# Patient Record
Sex: Female | Born: 1975 | Race: Black or African American | Hispanic: No | Marital: Single | Smoking: Never smoker
Health system: Southern US, Community
[De-identification: ages and names within clinical notes are randomized; demographics above are authoritative.]

## PROBLEM LIST (undated history)

## (undated) DIAGNOSIS — IMO0002 Reserved for concepts with insufficient information to code with codable children: Secondary | ICD-10-CM

## (undated) DIAGNOSIS — T7840XA Allergy, unspecified, initial encounter: Secondary | ICD-10-CM

## (undated) DIAGNOSIS — G4710 Hypersomnia, unspecified: Secondary | ICD-10-CM

## (undated) DIAGNOSIS — R0602 Shortness of breath: Secondary | ICD-10-CM

## (undated) DIAGNOSIS — R002 Palpitations: Secondary | ICD-10-CM

## (undated) DIAGNOSIS — B009 Herpesviral infection, unspecified: Secondary | ICD-10-CM

## (undated) HISTORY — DX: Shortness of breath: R06.02

## (undated) HISTORY — DX: Herpesviral infection, unspecified: B00.9

## (undated) HISTORY — DX: Reserved for concepts with insufficient information to code with codable children: IMO0002

## (undated) HISTORY — DX: Hypersomnia, unspecified: G47.10

## (undated) HISTORY — DX: Allergy, unspecified, initial encounter: T78.40XA

## (undated) HISTORY — DX: Palpitations: R00.2

---

## 2001-01-01 ENCOUNTER — Other Ambulatory Visit: Admission: RE | Admit: 2001-01-01 | Discharge: 2001-01-01 | Payer: Self-pay

## 2002-08-13 ENCOUNTER — Other Ambulatory Visit: Admission: RE | Admit: 2002-08-13 | Discharge: 2002-08-13 | Payer: Self-pay | Admitting: Obstetrics and Gynecology

## 2003-05-31 ENCOUNTER — Emergency Department (HOSPITAL_COMMUNITY): Admission: AD | Admit: 2003-05-31 | Discharge: 2003-05-31 | Payer: Self-pay | Admitting: Emergency Medicine

## 2003-07-02 ENCOUNTER — Other Ambulatory Visit: Admission: RE | Admit: 2003-07-02 | Discharge: 2003-07-02 | Payer: Self-pay | Admitting: Obstetrics and Gynecology

## 2004-01-06 ENCOUNTER — Ambulatory Visit (HOSPITAL_COMMUNITY): Admission: RE | Admit: 2004-01-06 | Discharge: 2004-01-06 | Payer: Self-pay | Admitting: Obstetrics and Gynecology

## 2004-02-03 ENCOUNTER — Other Ambulatory Visit: Admission: RE | Admit: 2004-02-03 | Discharge: 2004-02-03 | Payer: Self-pay | Admitting: Obstetrics and Gynecology

## 2004-04-28 ENCOUNTER — Ambulatory Visit (HOSPITAL_COMMUNITY): Admission: RE | Admit: 2004-04-28 | Discharge: 2004-04-28 | Payer: Self-pay | Admitting: Obstetrics and Gynecology

## 2004-06-17 ENCOUNTER — Ambulatory Visit (HOSPITAL_COMMUNITY): Admission: RE | Admit: 2004-06-17 | Discharge: 2004-06-17 | Payer: Self-pay | Admitting: Obstetrics and Gynecology

## 2004-09-04 ENCOUNTER — Inpatient Hospital Stay (HOSPITAL_COMMUNITY): Admission: AD | Admit: 2004-09-04 | Discharge: 2004-09-07 | Payer: Self-pay | Admitting: Obstetrics and Gynecology

## 2005-10-24 ENCOUNTER — Other Ambulatory Visit: Admission: RE | Admit: 2005-10-24 | Discharge: 2005-10-24 | Payer: Self-pay | Admitting: Obstetrics and Gynecology

## 2010-05-04 ENCOUNTER — Other Ambulatory Visit: Payer: Self-pay

## 2010-05-04 ENCOUNTER — Other Ambulatory Visit: Payer: Self-pay | Admitting: Family Medicine

## 2010-05-04 DIAGNOSIS — M25572 Pain in left ankle and joints of left foot: Secondary | ICD-10-CM

## 2010-05-07 ENCOUNTER — Other Ambulatory Visit: Payer: Self-pay | Admitting: Orthopedic Surgery

## 2010-05-08 ENCOUNTER — Ambulatory Visit
Admission: RE | Admit: 2010-05-08 | Discharge: 2010-05-08 | Disposition: A | Discharge status: Home / Self Care | Payer: 59 | Source: Ambulatory Visit | Attending: Orthopedic Surgery | Admitting: Orthopedic Surgery

## 2010-05-08 ENCOUNTER — Other Ambulatory Visit: Payer: Self-pay

## 2010-09-18 DIAGNOSIS — IMO0002 Reserved for concepts with insufficient information to code with codable children: Secondary | ICD-10-CM

## 2010-09-18 DIAGNOSIS — R87612 Low grade squamous intraepithelial lesion on cytologic smear of cervix (LGSIL): Secondary | ICD-10-CM

## 2010-09-18 DIAGNOSIS — R87619 Unspecified abnormal cytological findings in specimens from cervix uteri: Secondary | ICD-10-CM

## 2010-09-18 HISTORY — DX: Reserved for concepts with insufficient information to code with codable children: IMO0002

## 2010-09-18 HISTORY — DX: Low grade squamous intraepithelial lesion on cytologic smear of cervix (LGSIL): R87.612

## 2010-09-18 HISTORY — DX: Unspecified abnormal cytological findings in specimens from cervix uteri: R87.619

## 2011-01-06 ENCOUNTER — Ambulatory Visit (INDEPENDENT_AMBULATORY_CARE_PROVIDER_SITE_OTHER): Payer: 59

## 2011-01-06 DIAGNOSIS — J069 Acute upper respiratory infection, unspecified: Secondary | ICD-10-CM

## 2011-01-06 DIAGNOSIS — J029 Acute pharyngitis, unspecified: Secondary | ICD-10-CM

## 2011-10-24 ENCOUNTER — Ambulatory Visit: Payer: Self-pay | Admitting: Obstetrics and Gynecology

## 2011-11-29 ENCOUNTER — Encounter: Payer: Self-pay | Admitting: Obstetrics and Gynecology

## 2011-11-29 ENCOUNTER — Ambulatory Visit (INDEPENDENT_AMBULATORY_CARE_PROVIDER_SITE_OTHER): Payer: 59 | Admitting: Obstetrics and Gynecology

## 2011-11-29 VITALS — BP 130/84 | HR 92 | Ht 67.5 in | Wt 364.5 lb

## 2011-11-29 DIAGNOSIS — Z124 Encounter for screening for malignant neoplasm of cervix: Secondary | ICD-10-CM

## 2011-11-29 DIAGNOSIS — N920 Excessive and frequent menstruation with regular cycle: Secondary | ICD-10-CM

## 2011-11-29 LAB — CBC
HCT: 36.7 % (ref 36.0–46.0)
Hemoglobin: 12.2 g/dL (ref 12.0–15.0)
MCH: 26.5 pg (ref 26.0–34.0)
MCV: 79.6 fL (ref 78.0–100.0)
Platelets: 308 10*3/uL (ref 150–400)
RBC: 4.61 MIL/uL (ref 3.87–5.11)
RDW: 15.8 % — ABNORMAL HIGH (ref 11.5–15.5)
WBC: 4.7 10*3/uL (ref 4.0–10.5)

## 2011-11-29 NOTE — Progress Notes (Signed)
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 Parke Simmers Change in PMH: none Change in WUJ:WJXB BP 130/84  Pulse 92  Ht 5' 7.5" (1.715 m)  Wt 364 lb 8 oz (165.336 kg)  BMI 56.25 kg/m2  LMP 11/22/2011 Pt with complaints: yes.  Her periods are q month lasting for 7-8 days.  No chest pain or sob.   Physical Examination: General appearance - alert, well appearing, and in no distress Mental status - normal mood, behavior, speech, dress, motor activity, and thought processes Neck - supple, no significant adenopathy,  thyroid exam: thyroid is normal in size without nodules or tenderness Chest - clear to auscultation, no wheezes, rales or rhonchi, symmetric air entry Heart - normal rate and regular rhythm Abdomen - soft, nontender, nondistended, no masses or organomegaly Breasts - breasts appear normal, no suspicious masses, no skin or nipple changes or axillary nodes Pelvic - normal external genitalia, vulva, vagina, cervix, uterus and adnexa Rectal - rectal exam not indicated Back exam - full range of motion, no tenderness, palpable spasm or pain on motion Neurological - alert, oriented, normal speech, no focal findings or movement disorder noted Musculoskeletal - no joint tenderness, deformity or swelling Extremities - no edema, redness or tenderness in the calves or thighs Skin - normal coloration and turgor, no rashes, no suspicious skin lesions noted Routine exam Pap sent yes h/o CIN 1 Mammogram due no abstinance used for contraception RT for shg/embx

## 2011-11-29 NOTE — Patient Instructions (Signed)

## 2011-12-01 ENCOUNTER — Telehealth: Payer: Self-pay | Admitting: Obstetrics and Gynecology

## 2011-12-01 ENCOUNTER — Encounter: Payer: Self-pay | Admitting: Obstetrics and Gynecology

## 2011-12-01 ENCOUNTER — Ambulatory Visit: Payer: Self-pay | Admitting: Obstetrics and Gynecology

## 2011-12-01 LAB — PAP IG W/ RFLX HPV ASCU

## 2011-12-01 NOTE — Telephone Encounter (Signed)
Tc to pt regarding msg, informed pt can pick up letter stating that she was here for an appt on 11/29/11 w/ ND whenever she is available to do so, pt voices agreement.

## 2011-12-29 ENCOUNTER — Encounter: Payer: 59 | Admitting: Obstetrics and Gynecology

## 2012-01-23 ENCOUNTER — Encounter: Payer: 59 | Admitting: Obstetrics and Gynecology

## 2012-03-07 ENCOUNTER — Ambulatory Visit (INDEPENDENT_AMBULATORY_CARE_PROVIDER_SITE_OTHER): Payer: BC Managed Care – PPO | Admitting: Family Medicine

## 2012-03-07 ENCOUNTER — Ambulatory Visit: Payer: BC Managed Care – PPO

## 2012-03-07 ENCOUNTER — Ambulatory Visit: Payer: Self-pay

## 2012-03-07 VITALS — BP 117/84 | HR 91 | Temp 98.4°F | Resp 17 | Ht 68.0 in | Wt 360.0 lb

## 2012-03-07 DIAGNOSIS — R05 Cough: Secondary | ICD-10-CM

## 2012-03-07 DIAGNOSIS — R062 Wheezing: Secondary | ICD-10-CM

## 2012-03-07 DIAGNOSIS — R059 Cough, unspecified: Secondary | ICD-10-CM

## 2012-03-07 DIAGNOSIS — J069 Acute upper respiratory infection, unspecified: Secondary | ICD-10-CM

## 2012-03-07 DIAGNOSIS — J019 Acute sinusitis, unspecified: Secondary | ICD-10-CM

## 2012-03-07 MED ORDER — IPRATROPIUM-ALBUTEROL 20-100 MCG/ACT IN AERS: 1.0000 | INHALATION_SPRAY | Freq: Four times a day (QID) | RESPIRATORY_TRACT | Status: DC | PRN

## 2012-03-07 MED ORDER — ALBUTEROL SULFATE (2.5 MG/3ML) 0.083% IN NEBU
2.5000 mg | INHALATION_SOLUTION | Freq: Once | RESPIRATORY_TRACT | Status: AC
  Administered 2012-03-07: 2.5 mg via RESPIRATORY_TRACT

## 2012-03-07 MED ORDER — BENZONATATE 100 MG PO CAPS: 200.0000 mg | ORAL_CAPSULE | Freq: Two times a day (BID) | ORAL | Status: DC | PRN

## 2012-03-07 MED ORDER — IPRATROPIUM BROMIDE 0.02 % IN SOLN
0.5000 mg | Freq: Once | RESPIRATORY_TRACT | Status: AC
  Administered 2012-03-07: 0.5 mg via RESPIRATORY_TRACT

## 2012-03-07 MED ORDER — LEVOFLOXACIN 500 MG PO TABS: 500.0000 mg | ORAL_TABLET | Freq: Every day | ORAL | Status: DC

## 2012-03-07 MED ORDER — HYDROCODONE-HOMATROPINE 5-1.5 MG/5ML PO SYRP: 5.0000 mL | ORAL_SOLUTION | Freq: Every evening | ORAL | Status: DC | PRN

## 2012-03-07 NOTE — Progress Notes (Signed)
[facility]:  Office Visit  Chief Complaint:  Chief Complaint  Patient presents with  . Cough  . Wheezing  . Headache    HPI: Jasmin Gonzalez is a 37 y.o. female who complains of  4 day history of dry cough, worsening. Sinus congestion/pain. Headache. No ear pain. + chills yesterday and then also this AM. Has tried Mucinex, alkaselzter drops.  + wheezing at night and earlier this AM.  Chest pain with coughing.+ h/o allergies, N/o asthma, tobacco use.  Past Medical History  Diagnosis Date  . Abnormal Pap smear 9/12  . 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 HSV-1 (herpes simplex virus 1) infection   . Allergy    Past Surgical History  Procedure Laterality Date  . Cesarean section     History   Social History  . Marital Status: Married    Spouse Name: N/A    Number of Children: N/A  . Years of Education: N/A   Social History Main Topics  . Smoking status: Never Smoker   . Smokeless tobacco: None  . Alcohol Use: Yes     Comment: rarely   . Drug Use: No  . Sexually Active: Yes    Birth Control/ Protection: None, Implant   Other Topics Concern  . None   Social History Narrative  . None   Family History  Problem Relation Age of Onset  . COPD Maternal Grandfather    Allergies  Allergen Reactions  . Amoxicillin   . Penicillins    Prior to Admission medications   Medication Sig Start Date End Date Taking? Authorizing Provider  folic acid (FOLVITE) 1 MG tablet Take 1 mg by mouth daily.   Yes Historical Provider, MD  hydrOXYzine (ATARAX/VISTARIL) 25 MG tablet Take 25 mg by mouth 3 (three) times daily as needed.    Historical Provider, MD     ROS: The patient denies night sweats, unintentional weight loss, dyspnea on exertion, nausea, vomiting, abdominal pain, dysuria, hematuria, melena, numbness, weakness, or tingling.   All other systems have been reviewed and were otherwise negative with the exception of those  mentioned in the HPI and as above.    PHYSICAL EXAM: Filed Vitals:   03/07/12 1358  BP: 117/84  Pulse: 91  Temp: 98.4 F (36.9 C)  Resp: 17   Filed Vitals:   03/07/12 1358  Height: 5\' 8"  (1.727 m)  Weight: 360 lb (163.295 kg)   Body mass index is 54.75 kg/(m^2).  General: Alert, no acute distress, tired appearing, obese AA female HEENT:  Normocephalic, atraumatic, oropharynx patent.  Cardiovascular:  Regular rate and rhythm, no rubs murmurs or gallops.  No Carotid bruits, radial pulse intact. No pedal edema.  Respiratory: Clear to auscultation bilaterally.  + wheezes; No rales, or rhonchi.  No cyanosis, no use of accessory musculature GI: No organomegaly, abdomen is soft and non-tender, positive bowel sounds.  No masses. Skin: No rashes. Neurologic: Facial musculature symmetric. Psychiatric: Patient is appropriate throughout our interaction. Lymphatic: No cervical lymphadenopathy Musculoskeletal: Gait intact.   LABS: Results for orders placed in visit on 11/29/11  CBC      Result Value Range   WBC 4.7  4.0 - 10.5 K/uL   RBC 4.61  3.87 - 5.11 MIL/uL   Hemoglobin 12.2  12.0 - 15.0 g/dL   HCT 45.4  09.8 - 11.9 %   MCV 79.6  78.0 - 100.0 fL   MCH 26.5  26.0 -  34.0 pg   MCHC 33.2  30.0 - 36.0 g/dL   RDW 16.1 (*) 09.6 - 04.5 %   Platelets 308  150 - 400 K/uL  PAP IG W/ RFLX HPV ASCU      Result Value Range   Specimen adequacy:       FINAL DIAGNOSIS:   (*)    COMMENTS:       RECOMMENDATIONS:       Cytotechnologist:       Pathologist:         EKG/XRAY:   Primary read interpreted by Dr. Conley Rolls at [facility]. ? suspicious for Right Lower Lobe PNA vs increase vascular markings   ASSESSMENT/PLAN: Encounter Diagnoses  Name Primary?  . Acute sinusitis Yes  . Acute upper respiratory infections of unspecified site   . Cough   . Wheezing    Presumptive treatment of PNA vs acute sinusitis with bronchitis Rx Levaquin Rx Tessalon Perles, Hydromet Rx Combivent Work note for 2  days off Duoneb neb treatement, wheezing improved after treatment Pre and post peak flow--250 and 400    Jasmin Schrodt PHUONG, DO 03/07/2012 2:30 PM

## 2013-05-16 ENCOUNTER — Encounter: Payer: Self-pay | Admitting: Cardiology

## 2013-05-24 ENCOUNTER — Ambulatory Visit: Payer: 59 | Admitting: Cardiology

## 2013-06-12 ENCOUNTER — Encounter: Payer: Self-pay | Admitting: Cardiology

## 2013-06-12 ENCOUNTER — Ambulatory Visit (INDEPENDENT_AMBULATORY_CARE_PROVIDER_SITE_OTHER): Payer: BC Managed Care – PPO | Admitting: Cardiology

## 2013-06-12 VITALS — BP 136/74 | HR 87 | Ht 68.0 in | Wt 360.0 lb

## 2013-06-12 DIAGNOSIS — R002 Palpitations: Secondary | ICD-10-CM

## 2013-06-12 HISTORY — DX: Palpitations: R00.2

## 2013-06-12 NOTE — Progress Notes (Signed)
  [address], [address] [place], [place]  [address] Phone: [phone] Fax:  [phone]  Date:  06/12/2013   ID:  VERDELLA STOEN, DOB [birth year], MRN [id number]  PCP:  Geraldo Pitter, MD  Cardiologist:  Armanda Magic, MD     History of Present Illness: Jasmin Gonzalez is a 38 y.o. female with no prior cardiac history presents today for evaluation of fluttering in her chest like a butterfly across her chest.  This started back in January and since then has become more frequent.  She saw her PCP and she told her that she had chest wall inflammation and was given Naproxen.  She said the Naproxen made her feel worse.  Sometimes she will feel a little shock with it.  She has noticed recently that she has had SOB mainly with exertion when walking or up moving around.  She has had some LE edema which is a chronic problem but she has noticed an increase in February.  She denies any dizziness or syncope.   Wt Readings from Last 3 Encounters:  06/12/13 360 lb (163.295 kg)  03/07/12 360 lb (163.295 kg)  11/29/11 364 lb 8 oz (165.336 kg)     Past Medical History  Diagnosis Date  . Abnormal Pap smear 9/12  . 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 HSV-1 (herpes simplex virus 1) infection   . Allergy     No current outpatient prescriptions on file.   No current facility-administered medications for this visit.    Allergies:    Allergies  Allergen Reactions  . Amoxicillin   . Penicillins     Social History:  The patient  reports that she has never smoked. She does not have any smokeless tobacco history on file. She reports that she drinks alcohol. She reports that she does not use illicit drugs.   Family History:  The patient's family history includes COPD in her maternal grandfather.   ROS:  Please see the history of present illness.      All other systems reviewed and negative.   PHYSICAL EXAM: VS:  BP 136/74  Pulse 87  Ht 5\' 8"  (1.727 m)  Wt 360 lb  (163.295 kg)  BMI 54.75 kg/m2 Well nourished, well developed, in no acute distress HEENT: normal Neck: no JVD Cardiac:  normal S1, S2; RRR; no murmur Lungs:  clear to auscultation bilaterally, no wheezing, rhonchi or rales Abd: soft, nontender, no hepatomegaly Ext: trace edema Skin: warm and dry Neuro:  CNs 2-12 intact, no focal abnormalities noted  EKG:  NSR with no ST changes and normal intervals     ASSESSMENT AND PLAN:  1. Heart palpitations with what she describes as a fluttering - event monitor to assess for arrhythmias  Followup with me in 4 weeks  Signed, Armanda Magic, MD 06/12/2013 3:44 PM

## 2013-06-12 NOTE — Patient Instructions (Signed)
Your physician has recommended that you wear an event monitor. Event monitors are medical devices that record the heart's electrical activity. Doctors most often [place] these monitors to diagnose arrhythmias. Arrhythmias are problems with the speed or rhythm of the heartbeat. The monitor is a small, portable device. You can wear one while you do your normal daily activities. This is usually used to diagnose what is causing palpitations/syncope (passing out).  Your physician recommends that you schedule a follow-up appointment in: 4 weeks ov after event monitor placed

## 2013-06-13 ENCOUNTER — Encounter (INDEPENDENT_AMBULATORY_CARE_PROVIDER_SITE_OTHER): Payer: BC Managed Care – PPO

## 2013-06-13 ENCOUNTER — Encounter: Payer: Self-pay | Admitting: *Deleted

## 2013-06-13 DIAGNOSIS — R002 Palpitations: Secondary | ICD-10-CM

## 2013-06-13 NOTE — Progress Notes (Signed)
Patient ID: Jasmin Gonzalez, female   DOB: 09-25-1975, 38 y.o.   MRN: [id number] [employer] 30 day cardiac event monitor applied to patient.

## 2013-07-17 ENCOUNTER — Encounter: Payer: Self-pay | Admitting: Cardiology

## 2013-07-17 ENCOUNTER — Ambulatory Visit (INDEPENDENT_AMBULATORY_CARE_PROVIDER_SITE_OTHER): Payer: BC Managed Care – PPO | Admitting: Cardiology

## 2013-07-17 VITALS — BP 122/92 | HR 90 | Ht 67.0 in | Wt 362.0 lb

## 2013-07-17 DIAGNOSIS — R600 Localized edema: Secondary | ICD-10-CM | POA: Insufficient documentation

## 2013-07-17 DIAGNOSIS — R0602 Shortness of breath: Secondary | ICD-10-CM

## 2013-07-17 DIAGNOSIS — G4710 Hypersomnia, unspecified: Secondary | ICD-10-CM

## 2013-07-17 DIAGNOSIS — R002 Palpitations: Secondary | ICD-10-CM

## 2013-07-17 DIAGNOSIS — R609 Edema, unspecified: Secondary | ICD-10-CM

## 2013-07-17 HISTORY — DX: Shortness of breath: R06.02

## 2013-07-17 HISTORY — DX: Hypersomnia, unspecified: G47.10

## 2013-07-17 NOTE — Patient Instructions (Addendum)
Your physician recommends that you continue on your current medications as directed. Please refer to the Current Medication list given to you today.  Your physician has recommended that you have a sleep study. This test records several body functions during sleep, including: brain activity, eye movement, oxygen and carbon dioxide blood levels, heart rate and rhythm, breathing rate and rhythm, the flow of air through your mouth and nose, snoring, body muscle movements, and chest and belly movement. ([facility])  Your physician has requested that you have an echocardiogram. Echocardiography is a painless test that uses sound waves to create images of your heart. It provides your doctor with information about the size and shape of your heart and how well your heart's chambers and valves are working. This procedure takes approximately one hour. There are no restrictions for this procedure.  Your physician recommends that you schedule a follow-up appointment As needed

## 2013-07-17 NOTE — Progress Notes (Signed)
[address] [place], [place]  [address] Phone: [phone]) [phone] Fax:  [phone]) [phone]  Date:  07/17/2013   ID:  Jasmin Gonzalez, DOB 1975-06-26, MRN [id number]  PCP:  Geraldo PitterBLAND,VEITA J, MD  Cardiologist:  Armanda Magicraci Turner, MD     History of Present Illness:  Jasmin Gonzalez is a 38 y.o. female with no prior cardiac history presents today for followup of fluttering in her chest like a butterfly across her chest. This started back in January and since then has become more frequent. She saw her PCP and she told her that she had chest wall inflammation and was given Naproxen. She said the Naproxen made her feel worse. Sometimes she will feel a little shock with it. She has noticed recently that she has had SOB mainly with exertion when walking or up moving around. She has had some LE edema which is a chronic problem but she has noticed an increase in February. She denies any dizziness or syncope. She says that she is still having the SOB which she mainly notices when she is sitting and still has swelling.  She has cut back on added salt in her diet.  She is concerned that she may have sleep apnea.  She does snore at night and has awakened on occasion with gasping for breath.  She feels tired when she gets up in the am   Wt Readings from Last 3 Encounters:  07/17/13 362 lb (164.202 kg)  06/12/13 360 lb (163.295 kg)  03/07/12 360 lb (163.295 kg)     Past Medical History  Diagnosis Date  . Abnormal Pap smear 9/12  . 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 HSV-1 (herpes simplex virus 1) infection   . Allergy     Current Outpatient Prescriptions  Medication Sig Dispense Refill  . Multiple Vitamin (MULTIVITAMIN) capsule Take 1 capsule by mouth daily.       No current facility-administered medications for this visit.    Allergies:    Allergies  Allergen Reactions  . Amoxicillin   . Penicillins     Social History:  The patient  reports that she has never smoked.  She does not have any smokeless tobacco history on file. She reports that she drinks alcohol. She reports that she does not use illicit drugs.   Family History:  The patient's family history includes COPD in her maternal grandfather.   ROS:  Please see the history of present illness.      All other systems reviewed and negative.   PHYSICAL EXAM: VS:  BP 122/92  Pulse 90  Ht 5\' 7"  (1.702 m)  Wt 362 lb (164.202 kg)  BMI 56.68 kg/m2 Well nourished, well developed, in no acute distress HEENT: normal Neck: no JVD Cardiac:  normal S1, S2; RRR; no murmur Lungs:  clear to auscultation bilaterally, no wheezing, rhonchi or rales Abd: soft, nontender, no hepatomegaly Ext: no edema Skin: warm and dry Neuro:  CNs 2-12 intact, no focal abnormalities noted       ASSESSMENT AND PLAN:  1. Palpitations with no arrhythmias on event monitor 2. SOB which I suspect is due to morbid obesity - I will get a 2D echo to evaluate LVF 3. LE edema most likely secondary to morbid obesity - she has trace edema on exam today 4. Hypersomnia with awakening gasping for breath and snoring - I will get a split night PSG  Followup with me PRN  Signed, Armanda Magicraci Turner,  MD 07/17/2013 8:15 AM

## 2013-08-12 ENCOUNTER — Other Ambulatory Visit (HOSPITAL_COMMUNITY): Payer: BC Managed Care – PPO

## 2013-08-22 ENCOUNTER — Other Ambulatory Visit (HOSPITAL_COMMUNITY): Payer: BC Managed Care – PPO

## 2013-09-04 ENCOUNTER — Other Ambulatory Visit (HOSPITAL_COMMUNITY): Payer: BC Managed Care – PPO

## 2013-09-27 ENCOUNTER — Ambulatory Visit (HOSPITAL_BASED_OUTPATIENT_CLINIC_OR_DEPARTMENT_OTHER): Payer: BC Managed Care – PPO | Attending: Cardiology

## 2013-09-27 VITALS — Ht 67.0 in | Wt 364.0 lb

## 2013-09-27 DIAGNOSIS — G4733 Obstructive sleep apnea (adult) (pediatric): Secondary | ICD-10-CM | POA: Diagnosis present

## 2013-09-27 DIAGNOSIS — G4710 Hypersomnia, unspecified: Secondary | ICD-10-CM

## 2013-10-04 ENCOUNTER — Telehealth: Payer: Self-pay | Admitting: Cardiology

## 2013-10-04 NOTE — Telephone Encounter (Signed)
Please let patient know that she has severe sleep apnea and set up CPAP titration

## 2013-10-04 NOTE — Sleep Study (Signed)
   NAME:  Jasmin Gonzalez DATE OF BIRTH: May 11, 1975 MEDICAL RECORD NUMBER [id number] LOCATION:  [facility] PHYSICIAN:  Quintella Reichert DATE OF STUDY:  09/27/2013  SLEEP STUDY TYPE:  Nocturnal Polysmonogram  INDICATION FOR STUDY:  Snoring, awakening choking and gasping for breath, nonrestorative sleep  EPWORTH SLEEPINESS SCALE:  4 HEIGHT: 6'7" WEIGHT:  364lbs NECK SIZE: 16.5"  MEDICATION: Reviewed in the sleep record  SLEEP ARCHITECTURE:  The patient had a total sleep time of 265 minutes with 0.5 minutes of slow wave sleep and only 15.5 minutes of REM sleep.  Sleep onset latency was normal at 8 minutes but REM sleep latency was markedly delayed at 191 minutes. Sleep efficiency was reduced at 72%.    RESPIRATORY DATA:  The patient was found to have 29 obstructive apneas, 1 central apnea and 138 obstructive hypopneas, giving her an AHI of 38/hr and an RDI of 40/hr.  The events occurred mainly in the supine position and during NREM sleep.  Snoring was observed throughout the study.  OXYGEN DATA:  The baseline O2 sat was 95% with the lowest O2 sat during REM at 83% and during NREM sleep at 82% with the patient's obstructive events.  CARDIAC DATA:  That patient maintained NSR with occasional PAC's during the study  MOVEMENT/PARASOMNIA:  No significant limb movements or abnormal behavior was noted.  IMPRESSION:RECOMMENDATION: 1.  Severe obstructive sleep apnea/hyponea syndrome, with an AHI of 38 per hour and oxygen desaturations as low as 82%.  Events occurred primarily in NREM sleep and in the supine position. 2.  Reduced sleep efficiency with increased frequency or awakenings due to respiratory events.   3.  Reduced slow wave and REM sleep 4.  Snoring was noted throughout the study. 5.  No significant leg movements noted during the study.   6.  Recommend proceeding with CPAP titration. 7.  The patient should be counseled in good sleep hygiene and weight loss. 8.  The  patient should be counseled to avoid sleeping in the supine position.  Signed:   Armanda Magic, MD [facility] 10/04/2013

## 2013-10-07 ENCOUNTER — Other Ambulatory Visit: Payer: Self-pay | Admitting: General Surgery

## 2013-10-07 DIAGNOSIS — G4733 Obstructive sleep apnea (adult) (pediatric): Secondary | ICD-10-CM

## 2013-10-07 NOTE — Telephone Encounter (Signed)
Pt is aware. Ordered in EPIC.

## 2013-11-18 ENCOUNTER — Encounter: Payer: Self-pay | Admitting: Cardiology

## 2013-12-06 ENCOUNTER — Encounter (HOSPITAL_BASED_OUTPATIENT_CLINIC_OR_DEPARTMENT_OTHER): Payer: BC Managed Care – PPO

## 2014-03-20 ENCOUNTER — Ambulatory Visit (HOSPITAL_COMMUNITY): Payer: BLUE CROSS/BLUE SHIELD | Attending: Cardiology | Admitting: Radiology

## 2014-03-20 DIAGNOSIS — R0602 Shortness of breath: Secondary | ICD-10-CM | POA: Diagnosis present

## 2014-03-20 NOTE — Progress Notes (Signed)
Echocardiogram performed.  

## 2015-01-19 ENCOUNTER — Ambulatory Visit (INDEPENDENT_AMBULATORY_CARE_PROVIDER_SITE_OTHER): Payer: BLUE CROSS/BLUE SHIELD | Admitting: Physician Assistant

## 2015-01-19 VITALS — BP 118/74 | HR 112 | Temp 99.1°F | Resp 18 | Ht 67.5 in | Wt 361.4 lb

## 2015-01-19 DIAGNOSIS — J029 Acute pharyngitis, unspecified: Secondary | ICD-10-CM

## 2015-01-19 DIAGNOSIS — B9789 Other viral agents as the cause of diseases classified elsewhere: Secondary | ICD-10-CM

## 2015-01-19 DIAGNOSIS — J069 Acute upper respiratory infection, unspecified: Secondary | ICD-10-CM

## 2015-01-19 LAB — POCT RAPID STREP A (OFFICE): Rapid Strep A Screen: NEGATIVE

## 2015-01-19 MED ORDER — BENZONATATE 100 MG PO CAPS: 100.0000 mg | ORAL_CAPSULE | Freq: Three times a day (TID) | ORAL | Status: DC | PRN

## 2015-01-19 MED ORDER — GUAIFENESIN ER 1200 MG PO TB12: 1.0000 | ORAL_TABLET | Freq: Two times a day (BID) | ORAL | Status: DC | PRN

## 2015-01-19 NOTE — Progress Notes (Signed)
  Medical screening examination/treatment/procedure(s) were performed by non-physician practitioner and as supervising physician I was immediately available for consultation/collaboration.     

## 2015-01-19 NOTE — Patient Instructions (Signed)
Get plenty of rest and drink at least 64 ounces of water daily. 

## 2015-01-19 NOTE — Progress Notes (Signed)
Patient ID: Jasmin Gonzalez, female    DOB: 12-27-1975, 40 y.o.   MRN: [id number]  PCP: Geraldo PitterBLAND,VEITA J, MD  Subjective:   Chief Complaint  Patient presents with  . Allergic Rhinitis     cough-dry;ST;hoarseness x 4 dys  . Headache    only when coughing alot on/off x 4 dys    HPI Presents for evaluation of illness.  Symptoms began with cough on Wednesday. Then worsened, "pulling from my chest," but non-productive. Has post-nasal drainage, but doesn't get much from her nose when she blows, and it's clear. This morning, throat feels mildly sore and dry. HA intermittently, when she coughs hard. Sneezing. Subjective fever/chills. TheraFlu. No muscle aches. No Flu shot this year.     Review of Systems As above. No GI/GU symptoms.    Patient Active Problem List   Diagnosis Date Noted  . Edema extremities 07/17/2013     Prior to Admission medications   Medication Sig Start Date End Date Taking? Authorizing Provider  Multiple Vitamin (MULTIVITAMIN) capsule Take 1 capsule by mouth daily.    Historical Provider, MD     No Active Allergies     Objective:  Physical Exam  Constitutional: She is oriented to person, place, and time. Vital signs are normal. She appears well-developed and well-nourished. She is active and cooperative. No distress.  BP 118/74 mmHg  Pulse 112  Temp(Src) 99.1 F (37.3 C) (Oral)  Resp 18  Ht 5' 7.5" (1.715 m)  Wt 361 lb 6.4 oz (163.93 kg)  BMI 55.74 kg/m2  SpO2 98%  LMP 11/30/2014 (Approximate)  HENT:  Head: Normocephalic and atraumatic.  Right Ear: Hearing normal.  Left Ear: Hearing normal.  Eyes: Conjunctivae are normal. No scleral icterus.  Neck: Normal range of motion. Neck supple. No thyromegaly present.  Cardiovascular: Normal rate, regular rhythm and normal heart sounds.   Pulses:      Radial pulses are 2+ on the right side, and 2+ on the left side.  Pulmonary/Chest: Effort normal and breath sounds normal.  Lymphadenopathy:       Head (right side): No tonsillar, no preauricular, no posterior auricular and no occipital adenopathy present.       Head (left side): No tonsillar, no preauricular, no posterior auricular and no occipital adenopathy present.    She has no cervical adenopathy.       Right: No supraclavicular adenopathy present.       Left: No supraclavicular adenopathy present.  Neurological: She is alert and oriented to person, place, and time. No sensory deficit.  Skin: Skin is warm, dry and intact. No rash noted. No cyanosis or erythema. Nails show no clubbing.  Psychiatric: She has a normal mood and affect.       Results for orders placed or performed in visit on 01/19/15  POCT rapid strep A  Result Value Ref Range   Rapid Strep A Screen Negative Negative       Assessment & Plan:   1. Pharyngitis Likely due to post nasal drainage - POCT rapid strep A - Culture, Group A Strep  2. Viral URI with cough Supportive care.  Anticipatory guidance.  RTC if symptoms worsen/persist. - Guaifenesin (MUCINEX MAXIMUM STRENGTH) 1200 MG TB12; Take 1 tablet (1,200 mg total) by mouth every 12 (twelve) hours as needed.  Dispense: 14 tablet; Refill: 1 - benzonatate (TESSALON) 100 MG capsule; Take 1-2 capsules (100-200 mg total) by mouth 3 (three) times daily as needed for cough.  Dispense: 40 capsule; Refill:  0   Fernande Bras, PA-C Physician Assistant-Certified [facility]

## 2015-01-20 LAB — CULTURE, GROUP A STREP: Organism ID, Bacteria: NORMAL

## 2015-05-31 ENCOUNTER — Ambulatory Visit (INDEPENDENT_AMBULATORY_CARE_PROVIDER_SITE_OTHER): Payer: BLUE CROSS/BLUE SHIELD

## 2015-05-31 ENCOUNTER — Ambulatory Visit (HOSPITAL_COMMUNITY)
Admission: EM | Admit: 2015-05-31 | Discharge: 2015-05-31 | Disposition: A | Discharge status: Home / Self Care | Payer: BLUE CROSS/BLUE SHIELD | Attending: Emergency Medicine | Admitting: Emergency Medicine

## 2015-05-31 ENCOUNTER — Encounter (HOSPITAL_COMMUNITY): Payer: Self-pay | Admitting: Emergency Medicine

## 2015-05-31 DIAGNOSIS — R0781 Pleurodynia: Secondary | ICD-10-CM | POA: Diagnosis not present

## 2015-05-31 DIAGNOSIS — K449 Diaphragmatic hernia without obstruction or gangrene: Secondary | ICD-10-CM

## 2015-05-31 NOTE — ED Provider Notes (Signed)
CSN: [id number]     Arrival date & time 05/31/15  1450 History   First MD Initiated Contact with Patient 05/31/15 1505     Chief Complaint  Patient presents with  . Pleurisy   (Consider location/radiation/quality/duration/timing/severity/associated sxs/prior Treatment) HPI History obtained from patient:  Pt presents with the cc PP:IRJJOof:Sharp chest pain  Duration of symptoms: Thursday Treatment prior to arrival: None because the pain is short-lived Context: Taking a deep breath she has pain that is sharp in nature most like a stabbing sensation she describes. She also states that she has had it a couple times after eating. 40 POUND WEIGHT GAIN.  Other symptoms include: None Pain score: 7 when is happening FAMILY HISTORY: Mother with hypertension SOCIAL HISTORY: Nonsmoker  Past Medical History  Diagnosis Date  . Abnormal Pap smear 9/12  . 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 HSV-1 (herpes simplex virus 1) infection   . Allergy   . 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 Palpitations 06/12/2013  . SOB (shortness of breath) 07/17/2013   Past Surgical History  Procedure Laterality Date  . Cesarean section     Family History  Problem Relation Age of Onset  . COPD Maternal Grandfather   . Hypertension Mother    Social History  Substance Use Topics  . Smoking status: Never Smoker   . Smokeless tobacco: Never Used  . Alcohol Use: 0.0 oz/week    0 Standard drinks or equivalent per week     Comment: rare-none   OB History    Gravida Para Term Preterm AB TAB SAB Ectopic Multiple Living   3 1        1      Review of Systems ROS +'ve  Chest pain with deep inspiration  Denies: HEADACHE, NAUSEA, ABDOMINAL PAIN,     CONGESTION, DYSURIA, SHORTNESS OF BREATH, CALF PAIN OR SWELLING. NO RISK FACTORS FOR PE.  Allergies  Review of patient's allergies indicates no known allergies.  Home Medications   Prior to Admission medications   Medication Sig Start Date End Date  Taking? Authorizing Provider  Multiple Vitamin (MULTIVITAMIN) capsule Take 1 capsule by mouth daily.   Yes Historical Provider, MD  benzonatate (TESSALON) 100 MG capsule Take 1-2 capsules (100-200 mg total) by mouth 3 (three) times daily as needed for cough. 01/19/15   Chelle Jeffery, PA-C  Guaifenesin (MUCINEX MAXIMUM STRENGTH) 1200 MG TB12 Take 1 tablet (1,200 mg total) by mouth every 12 (twelve) hours as needed. 01/19/15   Porfirio Oarhelle Jeffery, PA-C   Meds Ordered and Administered this Visit  Medications - No data to display  BP 146/101 mmHg  Pulse 107  Temp(Src) 98.4 F (36.9 C) (Oral)  Resp 20  SpO2 99%  LMP 05/03/2015 (Exact Date) No data found.   Physical Exam NURSES NOTES AND VITAL SIGNS REVIEWED. CONSTITUTIONAL: Well developed, well nourished, no acute distress HEENT: normocephalic, atraumatic EYES: Conjunctiva normal NECK:normal ROM, supple, no adenopathy PULMONARY:No respiratory distress, normal effort ABDOMINAL: Soft, ND, NT BS+, No CVAT MUSCULOSKELETAL: Normal ROM of all extremities,  SKIN: warm and dry without rash PSYCHIATRIC: Mood and affect, behavior are normal  ED Course  Procedures (including critical care time)  Labs Review Labs Reviewed - No data to display  Imaging Review Dg Chest 2 View  05/31/2015  CLINICAL DATA:  Chest pain on inspiration EXAM: CHEST  2 VIEW COMPARISON:  03/07/2012 FINDINGS: Cardiac shadow is within normal limits. The lungs are clear bilaterally. No acute bony abnormality is noted. IMPRESSION:  No active cardiopulmonary disease. Electronically Signed   By: Alcide Clever M.D.   On: 05/31/2015 16:02   I HAVE PERSONALLY  REVIEWED AND DISCUSSED RESULTS OF  X-RAYS WITH PATIENT PRIOR TO DISCHARGE.     Visual Acuity Review  Right Eye Distance:   Left Eye Distance:   Bilateral Distance:    Right Eye Near:   Left Eye Near:    Bilateral Near:      Discussion of diagnoses with patient. She is reassured she is not having a heart attack, and no  symptoms of a blood clot.    MDM   1. Esophageal hiatal hernia   2. Pleuritic chest pain      Patient is reassured that there are no issues that require transfer to higher level of care at this time or additional tests. Patient is advised to continue home symptomatic treatment. Patient is advised that if there are new or worsening symptoms to attend the emergency department, contact primary care provider, or return to UC. Instructions of care provided discharged home in stable condition.    THIS NOTE WAS GENERATED USING A VOICE RECOGNITION SOFTWARE PROGRAM. ALL REASONABLE EFFORTS  WERE MADE TO PROOFREAD THIS DOCUMENT FOR ACCURACY.  I have verbally reviewed the discharge instructions with the patient. A printed AVS was given to the patient.  All questions were answered prior to discharge.     Tharon Aquas, PA 05/31/15 [phone]

## 2015-05-31 NOTE — ED Notes (Signed)
[facility] a [facility] of medial chest wall pain that only occurs when she takes a deep breath. She also stated that it occurs after eating. The patient stated that this pain started 4 days prior.

## 2015-05-31 NOTE — Discharge Instructions (Signed)
Pleurisy Pleurisy is redness, puffiness (swelling), and soreness (inflammation) of the lining of the lungs. It can be hard to breathe and hurt to breathe. Coughing or deep breathing will make it hurt more. It is often caused by an existing infection or disease.  HOME CARE  Only take medicine as told by your doctor.  Only take antibiotic medicine as directed. Make sure to finish it even if you start to feel better. GET HELP RIGHT AWAY IF:   Your lips, fingernails, or toenails are blue or dark.  You cough up blood.  You have a hard time breathing.  Your pain is not controlled with medicine or it lasts for more than 1 week.  Your pain spreads (radiates) into your neck, arms, or jaw.  You are short of breath or wheezing.  You develop a fever, rash, throw up (vomit), or faint. MAKE SURE YOU:   Understand these instructions.  Will watch your condition.  Will get help right away if you are not doing well or get worse.   This information is not intended to replace advice given to you by your health care provider. Make sure you discuss any questions you have with your health care provider.   Document Released: 12/17/2007 Document Revised: 09/05/2012 Document Reviewed: 06/17/2012 Elsevier Interactive Patient Education 2016 Elsevier Inc. Hiatal Hernia A hiatal hernia occurs when part of your stomach slides above the muscle that separates your abdomen from your chest (diaphragm). You can be born with a hiatal hernia (congenital), or it may develop over time. In almost all cases of hiatal hernia, only the top part of the stomach pushes through.  Many people have a hiatal hernia with no symptoms. The larger the hernia, the more likely that you will have symptoms. In some cases, a hiatal hernia allows stomach acid to flow back into the tube that carries food from your mouth to your stomach (esophagus). This may cause heartburn symptoms. Severe heartburn symptoms may mean you have developed a  condition called gastroesophageal reflux disease (GERD).  CAUSES  Hiatal hernias are caused by a weakness in the opening (hiatus) where your esophagus passes through your diaphragm to attach to the upper part of your stomach. You may be born with a weakness in your hiatus, or a weakness can develop. RISK FACTORS Older age is a major risk factor for a hiatal hernia. Anything that increases pressure on your diaphragm can also increase your risk of a hiatal hernia. This includes:  Pregnancy.  Excess weight.  Frequent constipation. SIGNS AND SYMPTOMS  People with a hiatal hernia often have no symptoms. If symptoms develop, they are almost always caused by GERD. They may include:  Heartburn.  Belching.  Indigestion.  Trouble swallowing.  Coughing or wheezing.  Sore throat.  Hoarseness.  Chest pain. DIAGNOSIS  A hiatal hernia is sometimes found during an exam for another problem. Your health care provider may suspect a hiatal hernia if you have symptoms of GERD. Tests may be done to diagnose GERD. These may include:  X-rays of your stomach or chest.  An upper gastrointestinal (GI) series. This is an X-ray exam of your GI tract involving the use of a chalky liquid that you swallow. The liquid shows up clearly on the X-ray.  Endoscopy. This is a procedure to look into your stomach using a thin, flexible tube that has a tiny camera and light on the end of it. TREATMENT  If you have no symptoms, you may not need treatment. If you  have symptoms, treatment may include:  Dietary and lifestyle changes to help reduce GERD symptoms.  Medicines. These may include:  Over-the-counter antacids.  Medicines that make your stomach empty more quickly.  Medicines that block the production of stomach acid (H2 blockers).  Stronger medicines to reduce stomach acid (proton pump inhibitors).  You may need surgery to repair the hernia if other treatments are not helping. HOME CARE INSTRUCTIONS    Take all medicines as directed by your health care provider.  Quit smoking, if you smoke.  Try to achieve and maintain a healthy body weight.  Eat frequent small meals instead of three large meals a day. This keeps your stomach from getting too full.  Eat slowly.  Do not lie down right after eating.  Do noteat 1-2 hours before bed.   Do not drink beverages with caffeine. These include cola, coffee, cocoa, and tea.  Do not drink alcohol.  Avoid foods that can make symptoms of GERD worse. These may include:  Fatty foods.  Citrus fruits.  Other foods and drinks that contain acid.  Avoid putting pressure on your belly. Anything that puts pressure on your belly increases the amount of acid that may be pushed up into your esophagus.   Avoid bending over, especially after eating.  Raise the head of your bed by putting blocks under the legs. This keeps your head and esophagus higher than your stomach.  Do not wear tight clothing around your chest or stomach.  Try not to strain when having a bowel movement, when urinating, or when lifting heavy objects. SEEK MEDICAL CARE IF:  Your symptoms are not controlled with medicines or lifestyle changes.  You are having trouble swallowing.  You have coughing or wheezing that will not go away. SEEK IMMEDIATE MEDICAL CARE IF:  Your pain is getting worse.  Your pain spreads to your arms, neck, jaw, teeth, or back.  You have shortness of breath.  You sweat for no reason.  You feel sick to your stomach (nauseous) or vomit.  You vomit blood.  You have bright red blood in your stools.  You have black, tarry stools.    This information is not intended to replace advice given to you by your health care provider. Make sure you discuss any questions you have with your health care provider.   Document Released: 03/26/2003 Document Revised: 01/24/2014 Document Reviewed: 12/21/2012 Elsevier Interactive Patient Education [employer].

## 2015-10-08 DIAGNOSIS — N926 Irregular menstruation, unspecified: Secondary | ICD-10-CM | POA: Insufficient documentation

## 2015-10-21 ENCOUNTER — Other Ambulatory Visit: Payer: Self-pay | Admitting: Physician Assistant

## 2015-10-21 DIAGNOSIS — B9789 Other viral agents as the cause of diseases classified elsewhere: Principal | ICD-10-CM

## 2015-10-21 DIAGNOSIS — J069 Acute upper respiratory infection, unspecified: Secondary | ICD-10-CM

## 2015-10-28 ENCOUNTER — Ambulatory Visit (INDEPENDENT_AMBULATORY_CARE_PROVIDER_SITE_OTHER): Payer: BLUE CROSS/BLUE SHIELD | Admitting: Urgent Care

## 2015-10-28 VITALS — BP 130/80 | HR 90 | Temp 99.1°F | Resp 17 | Ht 68.5 in | Wt 365.0 lb

## 2015-10-28 DIAGNOSIS — R519 Headache, unspecified: Secondary | ICD-10-CM

## 2015-10-28 DIAGNOSIS — R51 Headache: Secondary | ICD-10-CM

## 2015-10-28 DIAGNOSIS — J069 Acute upper respiratory infection, unspecified: Secondary | ICD-10-CM

## 2015-10-28 DIAGNOSIS — B9789 Other viral agents as the cause of diseases classified elsewhere: Secondary | ICD-10-CM | POA: Diagnosis not present

## 2015-10-28 MED ORDER — HYDROCODONE-HOMATROPINE 5-1.5 MG/5ML PO SYRP: 5.0000 mL | ORAL_SOLUTION | Freq: Every evening | ORAL | 0 refills | Status: DC | PRN

## 2015-10-28 MED ORDER — BENZONATATE 100 MG PO CAPS: 100.0000 mg | ORAL_CAPSULE | Freq: Three times a day (TID) | ORAL | 0 refills | Status: DC | PRN

## 2015-10-28 MED ORDER — PSEUDOEPHEDRINE HCL ER 120 MG PO TB12: 120.0000 mg | ORAL_TABLET | Freq: Two times a day (BID) | ORAL | 3 refills | Status: DC

## 2015-10-28 NOTE — Progress Notes (Addendum)
° °  By signing my name below, I, Jasmin Gonzalez, attest that this documentation has been prepared under the direction and in the presence of Jasmin Gonzalez, [place]-C.  Electronically Signed: Arvilla MarketMesha Gonzalez, Medical Scribe. 10/28/15. 11:23 AM.  MRN: [id number] DOB: 1975-12-27  Subjective:   Jasmin Gonzalez is a 40 y.o. female presenting for chief complaint of sinus pain onset 3 days ago. Reports associated symptoms of frontal headache, nasal congestion, dry cough, dry throat, ear fullness since yesterday, mild shortness of breath and sleep disturbance with coughing. States her ear popped yesterday, has had muffled hearing since and mentions her cough wakes her up at night. Pt has tried alka seltzer cold which briefly gave relief to her symptoms. Denies fever, ear pain, itchy eyes, ear drainage, sore throat, chest pain with coughing, and wheezing with couging. Pt denies smoking cigarettes and PMHx of allergies.  [name] is taking ferrous sulfate, multivitamin. Also has No Known Allergies.  [name]  has a past medical history of Abnormal Pap smear (9/12); Allergy; HSV-1 (herpes simplex virus 1) infection; Hypersomnia (07/17/2013); LSIL (low grade squamous intraepithelial lesion) on Pap smear (9/12); Palpitations (06/12/2013); and SOB (shortness of breath) (07/17/2013). Also  has a past surgical history that includes Cesarean section.  Objective:   Vitals: BP 130/80 (BP Location: Right Arm, Patient Position: Sitting, Cuff Size: Normal)    Pulse 90    Temp 99.1 F (37.3 C) (Oral)    Resp 17    Ht 5' 8.5" (1.74 m)    Wt (!) 365 lb (165.6 kg)    LMP 05/28/2015 (Approximate)    SpO2 98%    BMI 54.69 kg/m   Physical Exam  Constitutional: She is oriented to person, place, and time. She appears well-developed and well-nourished. No distress.  HENT:  Head: Normocephalic and atraumatic.  Right Ear: Tympanic membrane, external ear and ear canal normal.  Left Ear: Tympanic membrane, external ear and ear canal normal.  Nose:  Nose normal. Right sinus exhibits no maxillary sinus tenderness and no frontal sinus tenderness. Left sinus exhibits no maxillary sinus tenderness and no frontal sinus tenderness.  Mouth/Throat: Oropharynx is clear and moist.  Eyes: Conjunctivae are normal.  Neck: Neck supple.  Cardiovascular: Normal rate, regular rhythm, normal heart sounds and intact distal pulses.  Exam reveals no gallop and no friction rub.   No murmur heard. Pulmonary/Chest: Effort normal. No respiratory distress. She has no wheezes. She has no rales.  Lymphadenopathy:    She has no cervical adenopathy.  Neurological: She is alert and oriented to person, place, and time.  Skin: Skin is warm and dry.  Psychiatric: She has a normal mood and affect. Her behavior is normal.  Nursing note and vitals reviewed.  Assessment and Plan :   1. Viral URI with cough 2. Frontal headache - Will manage supportively, cough suppression recommended. RTC or call in 1 week if no improvement.  Jasmin BambergMario Mani, PA-C [facility] [phone] 10/28/2015 11:23 AM

## 2015-10-28 NOTE — Patient Instructions (Addendum)
Upper Respiratory Infection, Adult Most upper respiratory infections (URIs) are a viral infection of the air passages leading to the lungs. A URI affects the nose, throat, and upper air passages. The most common type of URI is nasopharyngitis and is typically referred to as "the common cold." URIs run their course and usually go away on their own. Most of the time, a URI does not require medical attention, but sometimes a bacterial infection in the upper airways can follow a viral infection. This is called a secondary infection. Sinus and middle ear infections are common types of secondary upper respiratory infections. Bacterial pneumonia can also complicate a URI. A URI can worsen asthma and chronic obstructive pulmonary disease (COPD). Sometimes, these complications can require emergency medical care and may be life threatening.  CAUSES Almost all URIs are caused by viruses. A virus is a type of germ and can spread from one person to another.  RISKS FACTORS You may be at risk for a URI if:   You smoke.   You have chronic heart or lung disease.  You have a weakened defense (immune) system.   You are very young or very old.   You have nasal allergies or asthma.  You work in crowded or poorly ventilated areas.  You work in health care facilities or schools. SIGNS AND SYMPTOMS  Symptoms typically develop 2-3 days after you come in contact with a cold virus. Most viral URIs last 7-10 days. However, viral URIs from the influenza virus (flu virus) can last 14-18 days and are typically more severe. Symptoms may include:   Runny or stuffy (congested) nose.   Sneezing.   Cough.   Sore throat.   Headache.   Fatigue.   Fever.   Loss of appetite.   Pain in your forehead, behind your eyes, and over your cheekbones (sinus pain).  Muscle aches.  DIAGNOSIS  Your health care provider may diagnose a URI by:  Physical exam.  Tests to check that your symptoms are not due to  another condition such as:  Strep throat.  Sinusitis.  Pneumonia.  Asthma. TREATMENT  A URI goes away on its own with time. It cannot be cured with medicines, but medicines may be prescribed or recommended to relieve symptoms. Medicines may help:  Reduce your fever.  Reduce your cough.  Relieve nasal congestion. HOME CARE INSTRUCTIONS   Take medicines only as directed by your health care provider.   Gargle warm saltwater or take cough drops to comfort your throat as directed by your health care provider.  Use a warm mist humidifier or inhale steam from a shower to increase air moisture. This may make it easier to breathe.  Drink enough fluid to keep your urine clear or pale yellow.   Eat soups and other clear broths and maintain good nutrition.   Rest as needed.   Return to work when your temperature has returned to normal or as your health care provider advises. You may need to stay home longer to avoid infecting others. You can also use a face mask and careful hand washing to prevent spread of the virus.  Increase the usage of your inhaler if you have asthma.   Do not use any tobacco products, including cigarettes, chewing tobacco, or electronic cigarettes. If you need help quitting, ask your health care provider. PREVENTION  The best way to protect yourself from getting a cold is to practice good hygiene.   Avoid oral or hand contact with people with cold   symptoms.   Wash your hands often if contact occurs.  There is no clear evidence that vitamin C, vitamin E, echinacea, or exercise reduces the chance of developing a cold. However, it is always recommended to get plenty of rest, exercise, and practice good nutrition.  SEEK MEDICAL CARE IF:   You are getting worse rather than better.   Your symptoms are not controlled by medicine.   You have chills.  You have worsening shortness of breath.  You have brown or red mucus.  You have yellow or brown nasal  discharge.  You have pain in your face, especially when you bend forward.  You have a fever.  You have swollen neck glands.  You have pain while swallowing.  You have white areas in the back of your throat. SEEK IMMEDIATE MEDICAL CARE IF:   You have severe or persistent:  Headache.  Ear pain.  Sinus pain.  Chest pain.  You have chronic lung disease and any of the following:  Wheezing.  Prolonged cough.  Coughing up blood.  A change in your usual mucus.  You have a stiff neck.  You have changes in your:  Vision.  Hearing.  Thinking.  Mood. MAKE SURE YOU:   Understand these instructions.  Will watch your condition.  Will get help right away if you are not doing well or get worse.   This information is not intended to replace advice given to you by your health care provider. Make sure you discuss any questions you have with your health care provider.   Document Released: 06/29/2000 Document Revised: 05/20/2014 Document Reviewed: 04/10/2013 Elsevier Interactive Patient Education 2016 Elsevier Inc.     IF you received an x-ray today, you will receive an invoice from [facility]. Please contact [facility] at [phone] with questions or concerns regarding your invoice.   IF you received labwork today, you will receive an invoice from [facility]. Please contact [facility] at [phone] with questions or concerns regarding your invoice.   Our billing staff will not be able to assist you with questions regarding bills from these companies.  You will be contacted with the lab results as soon as they are available. The fastest way to get your results is to activate your My Chart account. Instructions are located on the last page of this paperwork. If you have not heard from us regarding the results in 2 weeks, please contact this office.     

## 2016-01-01 ENCOUNTER — Other Ambulatory Visit: Payer: Self-pay | Admitting: Physician Assistant

## 2016-01-01 ENCOUNTER — Ambulatory Visit (INDEPENDENT_AMBULATORY_CARE_PROVIDER_SITE_OTHER): Payer: BLUE CROSS/BLUE SHIELD | Admitting: Physician Assistant

## 2016-01-01 VITALS — BP 132/82 | HR 73 | Temp 98.6°F | Resp 17 | Ht 68.5 in | Wt 366.0 lb

## 2016-01-01 DIAGNOSIS — R110 Nausea: Secondary | ICD-10-CM | POA: Diagnosis not present

## 2016-01-01 DIAGNOSIS — E162 Hypoglycemia, unspecified: Secondary | ICD-10-CM

## 2016-01-01 DIAGNOSIS — R42 Dizziness and giddiness: Secondary | ICD-10-CM | POA: Diagnosis not present

## 2016-01-01 DIAGNOSIS — R600 Localized edema: Secondary | ICD-10-CM

## 2016-01-01 LAB — POCT CBC
Granulocyte percent: 59.5 %G (ref 37–80)
HEMATOCRIT: 34.2 % — AB (ref 37.7–47.9)
Hemoglobin: 11.5 g/dL — AB (ref 12.2–16.2)
Lymph, poc: 1.8 (ref 0.6–3.4)
MCH, POC: 25 pg — AB (ref 27–31.2)
MCHC: 33.6 g/dL (ref 31.8–35.4)
MCV: 74.5 fL — AB (ref 80–97)
MID (cbc): 0.2 (ref 0–0.9)
MPV: 6.2 fL (ref 0–99.8)
POC Granulocyte: 3 (ref 2–6.9)
POC LYMPH PERCENT: 35.5 %L (ref 10–50)
POC MID %: 5 %M (ref 0–12)
Platelet Count, POC: 318 10*3/uL (ref 142–424)
RBC: 4.59 M/uL (ref 4.04–5.48)
RDW, POC: 23.3 %
WBC: 5 10*3/uL (ref 4.6–10.2)

## 2016-01-01 LAB — POC MICROSCOPIC URINALYSIS (UMFC): MUCUS RE: ABSENT

## 2016-01-01 LAB — POCT URINALYSIS DIP (MANUAL ENTRY)
BILIRUBIN UA: NEGATIVE
Bilirubin, UA: NEGATIVE
Blood, UA: NEGATIVE
GLUCOSE UA: NEGATIVE
LEUKOCYTES UA: NEGATIVE
Nitrite, UA: NEGATIVE
PH UA: 7
Protein Ur, POC: NEGATIVE
Spec Grav, UA: 1.025
Urobilinogen, UA: 0.2

## 2016-01-01 LAB — GLUCOSE, POCT (MANUAL RESULT ENTRY)
POC Glucose: 53 mg/dl — AB (ref 70–99)
POC Glucose: 88 mg/dL (ref 70–99)

## 2016-01-01 NOTE — Patient Instructions (Addendum)
Please make sure that you are hydrating well and have at least 3 clean meals per day. If your symptoms continue, I would like you to return to have more labs obtained.  I would like you to check your blood glucose, if you feel the dizziness or nausea.  If it happens again, let us know.   This may be brought on by a virus.  We will allow this about 7 days to clear up. If this does not improve, I am referring you to endocrinology. I will have you lab results soon (see below).  Hypoglycemia Hypoglycemia occurs when the level of sugar (glucose) in the blood is too low. Glucose is a type of sugar that provides the body's main source of energy. Certain hormones (insulin and glucagon) control the level of glucose in the blood. Insulin lowers blood glucose, and glucagon increases blood glucose. Hypoglycemia can result from having too much insulin in the bloodstream, or from not eating enough food that contains glucose. Hypoglycemia can happen in people who do or do not have diabetes. It can develop quickly, and it can be a medical emergency. What are the causes? Hypoglycemia occurs most often in people who have diabetes. If you have diabetes, hypoglycemia may be caused by:  Diabetes medicine.  Not eating enough, or not eating often enough.  Increased physical activity.  Drinking alcohol, especially when you have not eaten recently. If you do not have diabetes, hypoglycemia may be caused by:  A tumor in the pancreas. The pancreas is the organ that makes insulin.  Not eating enough, or not eating for long periods at a time (fasting).  Severe infection or illness that affects the liver, heart, or kidneys.  Certain medicines. You may also have reactive hypoglycemia. This condition causes hypoglycemia within 4 hours of eating a meal. This may occur after having stomach surgery. Sometimes, the cause of reactive hypoglycemia is not known. What increases the risk? Hypoglycemia is more likely to  develop in:  People who have diabetes and take medicines to lower blood glucose.  People who abuse alcohol.  People who have a severe illness. What are the signs or symptoms? Hypoglycemia may not cause any symptoms. If you have symptoms, they may include:  Hunger.  Anxiety.  Sweating and feeling clammy.  Confusion.  Dizziness or feeling light-headed.  Sleepiness.  Nausea.  Increased heart rate.  Headache.  Blurry vision.  Seizure.  Nightmares.  Tingling or numbness around the mouth, lips, or tongue.  A change in speech.  Decreased ability to concentrate.  A change in coordination.  Restless sleep.  Tremors or shakes.  Fainting.  Irritability. How is this diagnosed? Hypoglycemia is diagnosed with a blood test to measure your blood glucose level. This blood test is done while you are having symptoms. Your health care provider may also do a physical exam and review your medical history. If you do not have diabetes, other tests may be done to find the cause of your hypoglycemia. How is this treated? This condition can often be treated by immediately eating or drinking something that contains glucose, such as:  3-4 sugar tablets (glucose pills).  Glucose gel, 15-gram tube.  Fruit juice, 4 oz (120 mL).  Regular soda (not diet soda), 4 oz (120 mL).  Low-fat milk, 4 oz (120 mL).  Several pieces of hard candy.  Sugar or honey, 1 Tbsp. Treating Hypoglycemia If You Have Diabetes  If you are alert and able to swallow safely, follow the 15:15 rule:  Take 15 grams of a rapid-acting carbohydrate. Rapid-acting options include:  1 tube of glucose gel.  3 glucose pills.  6-8 pieces of hard candy.  4 oz (120 mL) of fruit juice.  4 oz (120 ml) of regular (not diet) soda.  Check your blood glucose 15 minutes after you take the carbohydrate.  If the repeat blood glucose level is still at or below 70 mg/dL (3.9 mmol/L), take 15 grams of a carbohydrate  again.  If your blood glucose level does not increase above 70 mg/dL (3.9 mmol/L) after 3 tries, seek emergency medical care.  After your blood glucose level returns to normal, eat a meal or a snack within 1 hour. Treating Severe Hypoglycemia  Severe hypoglycemia is when your blood glucose level is at or below 54 mg/dL (3 mmol/L). Severe hypoglycemia is an emergency. Do not wait to see if the symptoms will go away. Get medical help right away. Call your local emergency services (911 in the [place] Do not drive yourself to the hospital. If you have severe hypoglycemia and you cannot eat or drink, you may need an injection of glucagon. A family member or close friend should learn how to check your blood glucose and how to give you a glucagon injection. Ask your health care provider if you need to have an emergency glucagon injection kit available. Severe hypoglycemia may need to be treated in a hospital. The treatment may include getting glucose through an IV tube. You may also need treatment for the cause of your hypoglycemia. Follow these instructions at home: General instructions  Avoid any diets that cause you to not eat enough food. Talk with your health care provider before you start any new diet.  Take over-the-counter and prescription medicines only as told by your health care provider.  Limit alcohol intake to no more than 1 drink per day for nonpregnant women and 2 drinks per day for men. One drink equals 12 oz of beer, 5 oz of wine, or 1 oz of hard liquor.  Keep all follow-up visits as told by your health care provider. This is important. If You Have Diabetes:   Make sure you know the symptoms of hypoglycemia.  Always have a rapid-acting carbohydrate snack with you to treat low blood sugar.  Follow your diabetes management plan, as told by your health care provider. Make sure you:  Take your medicines as directed.  Follow your exercise plan.  Follow your meal plan. Eat on  time, and do not skip meals.  Check your blood glucose as often as directed. Make sure to check your blood glucose before and after exercise. If you exercise longer or in a different way than usual, check your blood glucose more often.  Follow your sick day plan whenever you cannot eat or drink normally. Make this plan in advance with your health care provider.  Share your diabetes management plan with people in your workplace, school, and household.  Check your urine for ketones when you are ill and as told by your health care provider.  Carry a medical alert card or wear medical alert jewelry. If You Have Reactive Hypoglycemia or Low Blood Sugar From Other Causes:  Monitor your blood glucose as told by your health care provider.  Follow instructions from your health care provider about eating or drinking restrictions. Contact a health care provider if:  You have problems keeping your blood glucose in your target range.  You have frequent episodes of hypoglycemia. Get help right away  if:  You continue to have hypoglycemia symptoms after eating or drinking something containing glucose.  Your blood glucose is at or below 54 mg/dL (3 mmol/L).  You have a seizure.  You faint. These symptoms may represent a serious problem that is an emergency. Do not wait to see if the symptoms will go away. Get medical help right away. Call your local emergency services (911 in the [place] Do not drive yourself to the hospital.  This information is not intended to replace advice given to you by your health care provider. Make sure you discuss any questions you have with your health care provider. Document Released: 01/03/2005 Document Revised: 06/17/2015 Document Reviewed: 02/06/2015 Elsevier Interactive Patient Education  2017 [place].  Iron-Rich Diet Introduction Iron is a mineral that helps your body to produce hemoglobin. Hemoglobin is a protein in your red blood cells that carries oxygen  to your body's tissues. Eating too little iron may cause you to feel weak and tired, and it can increase your risk for infection. Eating enough iron is necessary for your body's metabolism, muscle function, and nervous system. Iron is naturally found in many foods. It can also be added to foods or fortified in foods. There are two types of dietary iron:  Heme iron. Heme iron is absorbed by the body more easily than nonheme iron. Heme iron is found in meat, poultry, and fish.  Nonheme iron. Nonheme iron is found in dietary supplements, iron-fortified grains, beans, and vegetables. You may need to follow an iron-rich diet if:  You have been diagnosed with iron deficiency or iron-deficiency anemia.  You have a condition that prevents you from absorbing dietary iron, such as:  Infection in your intestines.  Celiac disease. This involves long-lasting (chronic) inflammation of your intestines.  You do not eat enough iron.  You eat a diet that is high in foods that impair iron absorption.  You have lost a lot of blood.  You have heavy bleeding during your menstrual cycle.  You are pregnant. What is my plan? Your health care provider may help you to determine how much iron you need per day based on your condition. Generally, when a person consumes sufficient amounts of iron in the diet, the following iron needs are met:  Men.  12-46 years old: 11 mg per day.  58-57 years old: 8 mg per day.  Women.  4-11 years old: 15 mg per day.  25-82 years old: 18 mg per day.  Over 81 years old: 8 mg per day.  Pregnant women: 27 mg per day.  Breastfeeding women: 9 mg per day. What do I need to know about an iron-rich diet?  Eat fresh fruits and vegetables that are high in vitamin C along with foods that are high in iron. This will help increase the amount of iron that your body absorbs from food, especially with foods containing nonheme iron. Foods that are high in vitamin C include oranges,  peppers, tomatoes, and mango.  Take iron supplements only as directed by your health care provider. Overdose of iron can be life-threatening. If you were prescribed iron supplements, take them with orange juice or a vitamin C supplement.  Cook foods in pots and pans that are made from iron.  Eat nonheme iron-containing foods alongside foods that are high in heme iron. This helps to improve your iron absorption.  Certain foods and drinks contain compounds that impair iron absorption. Avoid eating these foods in the same meal as iron-rich foods  or with iron supplements. These include:  Coffee, black tea, and red wine.  Milk, dairy products, and foods that are high in calcium.  Beans, soybeans, and peas.  Whole grains.  When eating foods that contain both nonheme iron and compounds that impair iron absorption, follow these tips to absorb iron better.  Soak beans overnight before cooking.  Soak whole grains overnight and drain them before using.  Ferment flours before baking, such as using yeast in bread dough. What foods can I eat? Grains  Iron-fortified breakfast cereal. Iron-fortified whole-wheat bread. Enriched rice. Sprouted grains. Vegetables  Spinach. Potatoes with skin. Green peas. Broccoli. Red and green bell peppers. Fermented vegetables. Fruits  Prunes. Raisins. Oranges. Strawberries. Mango. Grapefruit. Meats and Other Protein Sources  Beef liver. Oysters. Beef. Shrimp. [place]. Chicken. Mark. Sardines. Chickpeas. Nuts. Tofu. Beverages  Tomato juice. Fresh orange juice. Prune juice. Hibiscus tea. Fortified instant breakfast shakes. Condiments  Tahini. Fermented soy sauce. Sweets and Desserts  Black-strap molasses. Other  Wheat germ. The items listed above may not be a complete list of recommended foods or beverages. Contact your dietitian for more options.  What foods are not recommended? Grains  Whole grains. Bran cereal. Bran flour. Oats. Vegetables  Artichokes.  Brussels sprouts. Kale. Fruits  Blueberries. Raspberries. Strawberries. Figs. Meats and Other Protein Sources  Soybeans. Products made from soy protein. Dairy  Milk. Cream. Cheese. Yogurt. Cottage cheese. Beverages  Coffee. Black tea. Red wine. Sweets and Desserts  Cocoa. Chocolate. Ice cream. Other  Basil. Oregano. Parsley. The items listed above may not be a complete list of foods and beverages to avoid. Contact your dietitian for more information.  This information is not intended to replace advice given to you by your health care provider. Make sure you discuss any questions you have with your health care provider. Document Released: 08/17/2004 Document Revised: 07/24/2015 Document Reviewed: 07/31/2013  2017 Elsevier    IF you received an x-ray today, you will receive an invoice from [facility]. Please contact [facility] at [phone] with questions or concerns regarding your invoice.   IF you received labwork today, you will receive an invoice from [place]. Please contact [facility] at [phone] with questions or concerns regarding your invoice.   Our billing staff will not be able to assist you with questions regarding bills from these companies.  You will be contacted with the lab results as soon as they are available. The fastest way to get your results is to activate your My Chart account. Instructions are located on the last page of this paperwork. If you have not heard from [place] regarding the results in 2 weeks, please contact this office.

## 2016-01-01 NOTE — Progress Notes (Unsigned)
[facility] [address], [place] [address] [phone]  Date:  01/01/2016   Name:  Jasmin Gonzalez   DOB:  September 07, 1975   MRN:  [id number]  PCP:  Geraldo PitterBLAND,VEITA J, MD    History of Present Illness:  Jasmin Gonzalez is a 40 y.o. female patient [facility]     Patient Active Problem List   Diagnosis Date Noted  . Edema extremities 07/17/2013    Past Medical History:  Diagnosis Date  . Abnormal Pap smear 9/12  . Allergy   . HSV-1 (herpes simplex virus 1) infection   . 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 Palpitations 06/12/2013  . SOB (shortness of breath) 07/17/2013    Past Surgical History:  Procedure Laterality Date  . CESAREAN SECTION      Social History  Substance Use Topics  . Smoking status: Never Smoker  . Smokeless tobacco: Never Used  . Alcohol use 0.0 oz/week     Comment: rare-none    Family History  Problem Relation Age of Onset  . COPD Maternal Grandfather   . Hypertension Mother     No Known Allergies  Medication list has been reviewed and updated.  Current Outpatient Prescriptions on File Prior to Visit  Medication Sig Dispense Refill  . ferrous sulfate 325 (65 FE) MG tablet Take 325 mg by mouth daily with breakfast.    . Multiple Vitamin (MULTIVITAMIN) capsule Take 1 capsule by mouth daily.     No current facility-administered medications on file prior to visit.     ROS   Physical Examination: LMP 12/20/2015 (Approximate)  Ideal Body Weight:    Physical Exam   Assessment and Plan: Jasmin Gonzalez is a 40 y.o. female who is here today  There are no diagnoses linked to this encounter.  Trena PlattStephanie Jaquanna Ballentine, PA-C [facility] 01/01/2016 1:17 PM

## 2016-01-01 NOTE — Progress Notes (Signed)
[facility] [address], [place] [address] [phone]  Date:  01/01/2016   Name:  Jasmin Gonzalez   DOB:  [birth year]   MRN:  [id number]  PCP:  Elyn Peers, MD    History of Present Illness:  Jasmin Gonzalez is a 40 y.o. female patient who presents to [facility] for cc of dizziness.    Patient stated that it started this morning at 4am.  She was walking to go to the bathroom, where she felt nausea initially.  Room started spinning as she was suing the bathroom.  Dizziness with closing eyes.  Hot flash.  No chest pain, palpitations, or sob.  Mild diaphoresis.  The dizziness and nausea still lingers.  No vomiting or diarrhea (GI complaints), now or within the last few days.  No coughing.  She has been sneezing for the last 4 days.  No hematuria, frequency, dysuria.  No polydipsia or tremulousness.  She sleeps with 2 pillows, but can be comfortable with one.    Not sexually active since 2013.  No increased anxiety or stressors.  Hx of iron deficiency.  Takes iron supplements.  No blood in stool.  Dark stool secondary to iron.     Menses normal   Patient Active Problem List   Diagnosis Date Noted  . Edema extremities 07/17/2013    Past Medical History:  Diagnosis Date  . Abnormal Pap smear 9/12  . Allergy   . HSV-1 (herpes simplex virus 1) infection   . 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 Palpitations 06/12/2013  . SOB (shortness of breath) 07/17/2013    Past Surgical History:  Procedure Laterality Date  . CESAREAN SECTION      Social History  Substance Use Topics  . Smoking status: Never Smoker  . Smokeless tobacco: Never Used  . Alcohol use 0.0 oz/week     Comment: rare-none    Family History  Problem Relation Age of Onset  . COPD Maternal Grandfather   . Hypertension Mother     No Known Allergies  Medication list has been reviewed and updated.  Current Outpatient Prescriptions on File  Prior to Visit  Medication Sig Dispense Refill  . ferrous sulfate 325 (65 FE) MG tablet Take 325 mg by mouth daily with breakfast.    . Multiple Vitamin (MULTIVITAMIN) capsule Take 1 capsule by mouth daily.     No current facility-administered medications on file prior to visit.     ROS ROS otherwise unremarkable unless listed above   Physical Examination: BP 132/82 (BP Location: Right Arm, Patient Position: Sitting, Cuff Size: Large)   Pulse 73   Temp 98.6 F (37 C) (Oral)   Resp 17   Ht 5' 8.5" (1.74 m)   Wt (!) 366 lb (166 kg)   LMP 12/20/2015 (Approximate)   SpO2 98%   BMI 54.84 kg/m  Ideal Body Weight: Weight in (lb) to have BMI = 25: 166.5  Physical Exam  Constitutional: She is oriented to person, place, and time. She appears well-developed and well-nourished. No distress.  HENT:  Head: Normocephalic and atraumatic.  Right Ear: External ear normal.  Left Ear: External ear normal.  Eyes: Conjunctivae and EOM are normal. Pupils are equal, round, and reactive to light.  Cardiovascular: Normal rate and regular rhythm.  Exam reveals no friction rub.   No murmur heard. Pulses:      Carotid pulses are 2+ on the  right side, and 2+ on the left side.      Radial pulses are 2+ on the right side, and 2+ on the left side.       Posterior tibial pulses are 1+ on the right side, and 1+ on the left side.  Pulmonary/Chest: Effort normal. No apnea. No respiratory distress. She has no decreased breath sounds. She has no wheezes. She has no rhonchi.  Musculoskeletal: She exhibits edema.  Neurological: She is alert and oriented to person, place, and time.  Skin: She is not diaphoretic.  Psychiatric: She has a normal mood and affect. Her behavior is normal.    Orthostatic VS for the past 24 hrs (Last 3 readings):  BP- Lying Pulse- Lying BP- Sitting Pulse- Sitting BP- Standing at 0 minutes Pulse- Standing at 0 minutes  01/01/16 1236 119/80 77 116/84 78 114/79 88     Assessment and  Plan: Jasmin Gonzalez is a 40 y.o. female who is here today for cc of nausea and dizziness that started this mornign.  Advised eating 3 meals per day, and snacking.  If this continues we may need to pursue other reasons for this hypoglycemia, and she has been alerted to return if symptoms or do not improve within 7 days.  Possibility of viral secondary cause. Given glucometer today and educated of use. 45 minute direct contact.  Nausea without vomiting - Plan: POCT CBC, POCT glucose (manual entry), CMP14+EGFR, TSH, Brain natriuretic peptide  Dizziness - Plan: POCT CBC, POCT glucose (manual entry), CMP14+EGFR, TSH, Brain natriuretic peptide  Extremity edema - Plan: POCT glucose (manual entry), CMP14+EGFR, TSH, Brain natriuretic peptide  Jasmin Drape, Jasmin Gonzalez [facility] 01/01/2016 12:29 PM

## 2016-01-02 LAB — SPECIMEN STATUS REPORT

## 2016-01-04 ENCOUNTER — Other Ambulatory Visit: Payer: Self-pay | Admitting: Emergency Medicine

## 2016-01-04 ENCOUNTER — Telehealth: Payer: Self-pay | Admitting: Emergency Medicine

## 2016-01-04 DIAGNOSIS — R110 Nausea: Secondary | ICD-10-CM

## 2016-01-04 DIAGNOSIS — R42 Dizziness and giddiness: Secondary | ICD-10-CM

## 2016-01-04 DIAGNOSIS — R600 Localized edema: Secondary | ICD-10-CM

## 2016-01-04 LAB — HEMOGLOBIN A1C
ESTIMATED AVERAGE GLUCOSE: 108 mg/dL
HEMOGLOBIN A1C: 5.4 % (ref 4.8–5.6)

## 2016-01-04 NOTE — Telephone Encounter (Signed)
Pt notified she will need to return to the clinic for repeat BMP  Natasha MeadJeri, from [employer] notified of issues with courier  Future BMP order placed.

## 2016-01-05 LAB — SPECIMEN STATUS REPORT

## 2016-01-06 LAB — TSH: TSH: 1.31 u[IU]/mL (ref 0.450–4.500)

## 2016-01-06 LAB — CMP14+EGFR
ALK PHOS: 64 IU/L (ref 39–117)
ALT: 16 IU/L (ref 0–32)
AST: 13 IU/L (ref 0–40)
Albumin/Globulin Ratio: 1.5 (ref 1.2–2.2)
Albumin: 4.1 g/dL (ref 3.5–5.5)
BUN/Creatinine Ratio: 15 (ref 9–23)
BUN: 11 mg/dL (ref 6–24)
CO2: 22 mmol/L (ref 18–29)
CREATININE: 0.71 mg/dL (ref 0.57–1.00)
Calcium: 9.1 mg/dL (ref 8.7–10.2)
Chloride: 100 mmol/L (ref 96–106)
GFR calc Af Amer: 123 mL/min/{1.73_m2} (ref >59)
GFR calc non Af Amer: 107 mL/min/{1.73_m2} (ref >59)
GLUCOSE: 88 mg/dL (ref 65–99)
Globulin, Total: 2.7 g/dL (ref 1.5–4.5)
POTASSIUM: 4.5 mmol/L (ref 3.5–5.2)
Sodium: 141 mmol/L (ref 134–144)
Total Protein: 6.8 g/dL (ref 6.0–8.5)

## 2016-01-06 LAB — BRAIN NATRIURETIC PEPTIDE

## 2018-06-02 ENCOUNTER — Other Ambulatory Visit: Payer: Self-pay

## 2018-06-02 ENCOUNTER — Encounter (HOSPITAL_COMMUNITY): Payer: Self-pay | Admitting: Emergency Medicine

## 2018-06-02 ENCOUNTER — Ambulatory Visit (HOSPITAL_COMMUNITY)
Admission: EM | Admit: 2018-06-02 | Discharge: 2018-06-02 | Disposition: A | Discharge status: Home / Self Care | Payer: BLUE CROSS/BLUE SHIELD | Attending: Family Medicine | Admitting: Family Medicine

## 2018-06-02 DIAGNOSIS — R0789 Other chest pain: Secondary | ICD-10-CM

## 2018-06-02 NOTE — Discharge Instructions (Signed)
This is highly unlikely to be related to your heart or lungs.  I encourage you to stay physically active.

## 2018-06-02 NOTE — ED Notes (Signed)
Patient able to ambulate independently  

## 2018-06-02 NOTE — ED Provider Notes (Signed)
  [facility]    CSN: [id number] Arrival date & time: 06/02/18  1045     History    Chief Complaint  Patient presents with  . Chest Pain    Jasmin Gonzalez is a 42 y.o. female here for evaluation of chest discomfort.  Duration of issue: 3 days Quality: difficult to describe, doesn't feel right to her Palliation: moving around Provocation: laying on L side Severity: 0/10 Radiation: none Duration of chest pain: several minutes Associated symptoms: L arm discomfort, feels that she is breathing fasting, but is not sob Cardiac history: none Family heart history: grandparent with MI at 59 yo Smoker? No  Recent death of fam member and got laid off due to pandemic, has been quite stressful lately.   ROS:  Cardiac: No current chest pain Lungs: No SOB  Past Medical History:  Diagnosis Date  . Abnormal Pap smear 9/12  . Allergy   . HSV-1 (herpes simplex virus 1) infection   . 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 Palpitations 06/12/2013  . SOB (shortness of breath) 07/17/2013   Family History  Problem Relation Age of Onset  . COPD Maternal Grandfather   . Hypertension Mother     Allergies as of 06/02/2018   No Known Allergies  BP (!) 157/107 (BP Location: Left Wrist)   Pulse 86   Resp 18   SpO2 100%  Gen: awake, alert, appears stated age HEENT: PERRLA, MMM Neck: No masses or asymmetry Heart: RRR, no bruits Lungs: CTAB, no accessory muscle use Abd: Soft, NT, ND, no masses or organomegaly MSK: chest discomfort is not reproducible to palptation Psych: Age appropriate judgment and insight, nml mood and affect   Final Clinical Impressions(s) / UC Diagnoses   Final diagnoses:  Atypical chest pain   Not likely to be related to heart or lungs. Counseled on diet and exercise. F/u with PCP regarding stress levels. Consider yoga and meditation.    Discharge Instructions     This is highly unlikely to be  related to your heart or lungs.  I encourage you to stay physically active.    ED Prescriptions    None     Controlled Substance Prescriptions [place] Controlled Substance Registry consulted? Not Applicable   Sharlene Dory, DO 06/02/18 1227

## 2018-06-02 NOTE — ED Triage Notes (Signed)
Pt presents to [facility] for assessment of 3-4 days of left sided chest pain, denies any associated symptoms.  Pt does state she has been under increased stress with not working and a recent death in the family.  She also states her grandmother died in the last year from a heart attack.

## 2019-08-16 ENCOUNTER — Other Ambulatory Visit: Payer: Self-pay

## 2019-08-16 ENCOUNTER — Emergency Department (HOSPITAL_COMMUNITY)
Admission: EM | Admit: 2019-08-16 | Discharge: 2019-08-16 | Disposition: A | Discharge status: Home / Self Care | Payer: BC Managed Care – PPO | Attending: Emergency Medicine | Admitting: Emergency Medicine

## 2019-08-16 ENCOUNTER — Emergency Department (HOSPITAL_COMMUNITY): Payer: BC Managed Care – PPO

## 2019-08-16 ENCOUNTER — Telehealth: Payer: Self-pay | Admitting: Infectious Diseases

## 2019-08-16 ENCOUNTER — Encounter (HOSPITAL_COMMUNITY): Payer: Self-pay

## 2019-08-16 ENCOUNTER — Ambulatory Visit (HOSPITAL_COMMUNITY)
Admission: EM | Admit: 2019-08-16 | Discharge: 2019-08-16 | Disposition: A | Discharge status: Home / Self Care | Payer: Self-pay

## 2019-08-16 ENCOUNTER — Other Ambulatory Visit: Payer: Self-pay | Admitting: Infectious Diseases

## 2019-08-16 DIAGNOSIS — E669 Obesity, unspecified: Secondary | ICD-10-CM

## 2019-08-16 DIAGNOSIS — U071 COVID-19: Secondary | ICD-10-CM | POA: Insufficient documentation

## 2019-08-16 DIAGNOSIS — J1282 Pneumonia due to coronavirus disease 2019: Secondary | ICD-10-CM

## 2019-08-16 DIAGNOSIS — Z79899 Other long term (current) drug therapy: Secondary | ICD-10-CM | POA: Insufficient documentation

## 2019-08-16 DIAGNOSIS — J189 Pneumonia, unspecified organism: Secondary | ICD-10-CM | POA: Diagnosis not present

## 2019-08-16 DIAGNOSIS — R0602 Shortness of breath: Secondary | ICD-10-CM | POA: Diagnosis present

## 2019-08-16 LAB — COMPREHENSIVE METABOLIC PANEL
ALT: 28 U/L (ref 0–44)
AST: 34 U/L (ref 15–41)
Albumin: 3.6 g/dL (ref 3.5–5.0)
Alkaline Phosphatase: 40 U/L (ref 38–126)
Anion gap: 9 (ref 5–15)
BUN: <5 mg/dL — ABNORMAL LOW (ref 6–20)
CO2: 24 mmol/L (ref 22–32)
Calcium: 8.6 mg/dL — ABNORMAL LOW (ref 8.9–10.3)
Chloride: 102 mmol/L (ref 98–111)
Creatinine, Ser: 0.75 mg/dL (ref 0.44–1.00)
GFR calc Af Amer: >60 mL/min (ref >60)
GFR calc non Af Amer: >60 mL/min (ref >60)
Glucose, Bld: 121 mg/dL — ABNORMAL HIGH (ref 70–99)
Potassium: 3.6 mmol/L (ref 3.5–5.1)
Sodium: 135 mmol/L (ref 135–145)
Total Bilirubin: 0.8 mg/dL (ref 0.3–1.2)
Total Protein: 7.5 g/dL (ref 6.5–8.1)

## 2019-08-16 LAB — CBC WITH DIFFERENTIAL/PLATELET
Abs Immature Granulocytes: 0.01 10*3/uL (ref 0.00–0.07)
Basophils Absolute: 0 10*3/uL (ref 0.0–0.1)
Basophils Relative: 0 %
Eosinophils Absolute: 0 10*3/uL (ref 0.0–0.5)
Eosinophils Relative: 0 %
HCT: 34.5 % — ABNORMAL LOW (ref 36.0–46.0)
Hemoglobin: 9.4 g/dL — ABNORMAL LOW (ref 12.0–15.0)
Immature Granulocytes: 0 %
Lymphocytes Relative: 36 %
Lymphs Abs: 1 10*3/uL (ref 0.7–4.0)
MCH: 17.9 pg — ABNORMAL LOW (ref 26.0–34.0)
MCHC: 27.2 g/dL — ABNORMAL LOW (ref 30.0–36.0)
MCV: 65.6 fL — ABNORMAL LOW (ref 80.0–100.0)
Monocytes Absolute: 0.3 10*3/uL (ref 0.1–1.0)
Monocytes Relative: 10 %
Neutro Abs: 1.6 10*3/uL — ABNORMAL LOW (ref 1.7–7.7)
Neutrophils Relative %: 54 %
Platelets: 320 10*3/uL (ref 150–400)
RBC: 5.26 MIL/uL — ABNORMAL HIGH (ref 3.87–5.11)
RDW: 19.7 % — ABNORMAL HIGH (ref 11.5–15.5)
WBC: 2.9 10*3/uL — ABNORMAL LOW (ref 4.0–10.5)
nRBC: 0 % (ref 0.0–0.2)

## 2019-08-16 LAB — I-STAT BETA HCG BLOOD, ED (MC, WL, AP ONLY): I-stat hCG, quantitative: <5 m[IU]/mL (ref <5)

## 2019-08-16 LAB — LACTIC ACID, PLASMA: Lactic Acid, Venous: 1.1 mmol/L (ref 0.5–1.9)

## 2019-08-16 LAB — SARS CORONAVIRUS 2 BY RT PCR (HOSPITAL ORDER, PERFORMED IN ~~LOC~~ HOSPITAL LAB): SARS Coronavirus 2: POSITIVE — AB

## 2019-08-16 MED ORDER — SODIUM CHLORIDE 0.9% FLUSH: 3.0000 mL | Freq: Once | INTRAVENOUS | Status: DC

## 2019-08-16 MED ORDER — SODIUM CHLORIDE 0.9 % IV SOLN
Freq: Once | INTRAVENOUS | Status: AC
  Filled 2019-08-16: qty 600

## 2019-08-16 NOTE — ED Triage Notes (Addendum)
Patient presents to the ED (+)COVID with shortness of breath +productive cough, fever, chills that started on Monday. Denies chest pain. Respirs even and unlabored. No acute distress.

## 2019-08-16 NOTE — Telephone Encounter (Signed)
Called to discuss with patient about Covid symptoms and the use of Regeneron, a monoclonal antibody infusion for those with mild to moderate Covid symptoms and at a high risk of hospitalization.  Pt is qualified for this infusion at the [facility] infusion center due to BMI>35   Sx started 7/26 - she has +CXR for pneumonia from ER visit tonight. No hypoxia and OK to discharge home.   She will bring her +COVID test with her to infusion appointment tomorrow AM. Full consent over the phone including possible financial obligations.    [name], MSN, NP-C [facility] Disease [facility]  [email or web address] Pager: [phone] Office: [phone] [facility] Main Line: [phone]

## 2019-08-16 NOTE — ED Provider Notes (Signed)
[facility] EMERGENCY DEPARTMENT Provider Note   CSN: [id number] Arrival date & time: 08/16/19  1053     History Chief Complaint  Patient presents with  . Shortness of Breath    Jasmin Gonzalez is a 44 y.o. female with no significant past medical history presents the ED with complaints of shortness of breath.  Patient endorses a 4-day history of headache, fevers, chills, body aches, and mildly productive cough.  Patient states that she became short of breath a couple of days ago and fatigue upon minimal exertion.  She states that this has been progressive in onset.  Patient reports that she tested positive at a local pharmacy on Monday and that everybody in her house has been ill and positively tested with COVID-19.  She denies any history of clots, clotting disorder, chest pain, pleuritic chest pain, OCPs or smoking history, recent surgeries or immobilization, difficulty breathing, dizziness, abdominal pain, nausea or vomiting, urinary symptoms, or changes in bowel habits.  She has been taking Tylenol and ibuprofen for fever control.  Patient states that she did not receive her COVID-19 vaccination.  HPI     Past Medical History:  Diagnosis Date  . Abnormal Pap smear 9/12  . Allergy   . HSV-1 (herpes simplex virus 1) infection   . 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 Palpitations 06/12/2013  . SOB (shortness of breath) 07/17/2013    Patient Active Problem List   Diagnosis Date Noted  . Edema extremities 07/17/2013    Past Surgical History:  Procedure Laterality Date  . CESAREAN SECTION       OB History    Gravida  3   Para  1   Term      Preterm      AB      Living  1     SAB      TAB      Ectopic      Multiple      Live Births              Family History  Problem Relation Age of Onset  . COPD Maternal Grandfather   . Hypertension Mother     Social History   Tobacco Use    . Smoking status: Never Smoker  . Smokeless tobacco: Never Used  Substance Use Topics  . Alcohol use: Yes    Alcohol/week: 0.0 standard drinks    Comment: rare-none  . Drug use: No    Home Medications Prior to Admission medications   Medication Sig Start Date End Date Taking? Authorizing Provider  ferrous sulfate 325 (65 FE) MG tablet Take 325 mg by mouth daily with breakfast.    [provider]  Multiple Vitamin (MULTIVITAMIN) capsule Take 1 capsule by mouth daily.    [provider]    Allergies    Patient has no known allergies.  Review of Systems   Review of Systems  All other systems reviewed and are negative.   Physical Exam Updated Vital Signs BP 94/68   Pulse (!) 107   Temp 98.4 F (36.9 C) (Oral)   Resp 20   SpO2 100%   Physical Exam Vitals and nursing note reviewed. Exam conducted with a chaperone present.  Constitutional:      General: She is not in acute distress.    Appearance: Normal appearance. She is obese.  HENT:     Head: Normocephalic and atraumatic.  Eyes:     General: No scleral icterus.    Conjunctiva/sclera: Conjunctivae normal.  Cardiovascular:     Rate and Rhythm: Regular rhythm. Tachycardia present.     Pulses: Normal pulses.     Heart sounds: Normal heart sounds.  Pulmonary:     Comments: No significant increased work of breathing.  Patient able to speak in full sentences.  Breath sounds intact bilaterally.  No wheezing or stridor appreciated.  No accessory muscle use.  No distress. Musculoskeletal:     Cervical back: Normal range of motion and neck supple. No rigidity.     Comments: No asymmetrical lower leg edema or overlying skin changes.  Peripheral pulses intact and symmetric throughout.  Skin:    General: Skin is dry.     Capillary Refill: Capillary refill takes less than 2 seconds.  Neurological:     Mental Status: She is alert and oriented to person, place, and time.     GCS: GCS eye subscore is 4. GCS  verbal subscore is 5. GCS motor subscore is 6.  Psychiatric:        Mood and Affect: Mood normal.        Behavior: Behavior normal.        Thought Content: Thought content normal.     ED Results / Procedures / Treatments   Labs (all labs ordered are listed, but only abnormal results are displayed) Labs Reviewed  COMPREHENSIVE METABOLIC PANEL - Abnormal; Notable for the following components:      Result Value   Glucose, Bld 121 (*)    BUN <5 (*)    Calcium 8.6 (*)    All other components within normal limits  CBC WITH DIFFERENTIAL/PLATELET - Abnormal; Notable for the following components:   WBC 2.9 (*)    RBC 5.26 (*)    Hemoglobin 9.4 (*)    HCT 34.5 (*)    MCV 65.6 (*)    MCH 17.9 (*)    MCHC 27.2 (*)    RDW 19.7 (*)    Neutro Abs 1.6 (*)    All other components within normal limits  SARS CORONAVIRUS 2 BY RT PCR (HOSPITAL ORDER, PERFORMED IN [facility])  LACTIC ACID, PLASMA  LACTIC ACID, PLASMA  URINALYSIS, ROUTINE W REFLEX MICROSCOPIC  I-STAT BETA HCG BLOOD, ED ([facility], [facility], [facility] ONLY)    EKG None  Radiology DG Chest Portable 1 View  Result Date: 08/16/2019 CLINICAL DATA:  Shortness of breath.  COVID positive EXAM: PORTABLE CHEST 1 VIEW COMPARISON:  05/31/2015 FINDINGS: Low volume chest with mild patchy infiltrate on both sides. No edema or effusion. Normal heart size and mediastinal contours IMPRESSION: Low volume chest with atypical pneumonia pattern. Electronically Signed   By: Marnee Spring M.D.   On: 08/16/2019 12:00    Procedures Procedures (including critical care time)  Medications Ordered in ED Medications  sodium chloride flush (NS) 0.9 % injection 3 mL (has no administration in time range)    ED Course  I have reviewed the triage vital signs and the nursing notes.  Pertinent labs & imaging results that were available during my care of the patient were reviewed by me and considered in my medical decision making (see chart for details).      MDM Rules/Calculators/A&P                          Patient presents to the ED with collection of symptoms consistent with her  reported COVID-19 diagnosis.  While I cannot locate the lab confirming her COVID-19 diagnosis, she states that her and her entire family have all tested positive.  Her leukopenia and laboratory work-up and atypical pneumonia seen on chest x-ray are consistent with that report.  Patient's progressively worsening shortness of breath is likely attributed to her pneumonia that is well visualized on plain films.  Low suspicion for PE at this time.  While she has been in the low 100s during her stay here in the ED, she was also down in the 80s and 90s on my examination.  She has no chest pain or pleuritic symptoms.  She is actively coughing on my examination.  No other risk factors for PE aside from her COVID-19 diagnosis.  No increased work of breathing on my exam.  EKG does not show S1Q3T3 or evidence of ischemia.  Lower suspicion for pericarditis or myocarditis.  Do not feel as though troponin is warranted as she is denying any pain symptoms.  Discussed conservative therapy with the patient.  She will follow up with the infusion center tomorrow for an IV infusion.  I have already spoken with the [facility] infusion team and they will call her this evening.  They are planning for infusion at 10:30 AM tomorrow.  All of the evaluation and work-up results were discussed with the patient and any family at bedside.  Patient and/or family were informed that while patient is appropriate for discharge at this time, some medical emergencies may only develop or become detectable after a period of time.  I specifically instructed patient and/or family to return to return to the ED or seek immediate medical attention for any new or worsening symptoms.  They were provided opportunity to ask any additional questions and have none at this time.  Prior to discharge patient is feeling well, agreeable with  plan for discharge home.  They have expressed understanding of verbal discharge instructions as well as return precautions and are agreeable to the plan.   Jasmin Gonzalez was evaluated in Emergency Department on 08/16/2019 for the symptoms described in the history of present illness. She was evaluated in the context of the global COVID-19 pandemic, which necessitated consideration that the patient might be at risk for infection with the SARS-CoV-2 virus that causes COVID-19. Institutional protocols and algorithms that pertain to the evaluation of patients at risk for COVID-19 are in a state of rapid change based on information released by regulatory bodies including the CDC and federal and state organizations. These policies and algorithms were followed during the patient's care in the ED.   Final Clinical Impression(s) / ED Diagnoses Final diagnoses:  Pneumonia due to COVID-19 virus    Rx / DC Orders ED Discharge Orders    None       Lorelee New, PA-C 08/16/19 1859    Derwood Kaplan, MD 08/18/19 1510

## 2019-08-16 NOTE — Progress Notes (Signed)
I connected by phone with Jasmin Gonzalez on 08/16/2019 at 8:42 PM to discuss the potential use of a new treatment for mild to moderate COVID-19 viral infection in non-hospitalized patients.  This patient is a 44 y.o. female that meets the FDA criteria for Emergency Use Authorization of COVID monoclonal antibody casirivimab/imdevimab.  Has a (+) direct SARS-CoV-2 viral test result  Has mild or moderate COVID-19   Is NOT hospitalized due to COVID-19  Is within 10 days of symptom onset  Has at least one of the high risk factor(s) for progression to severe COVID-19 and/or hospitalization as defined in EUA.  Specific high risk criteria : BMI > 25   I have spoken and communicated the following to the patient or parent/caregiver regarding COVID monoclonal antibody treatment:  1. FDA has authorized the emergency use for the treatment of mild to moderate COVID-19 in adults and pediatric patients with positive results of direct SARS-CoV-2 viral testing who are 51 years of age and older weighing at least 40 kg, and who are at high risk for progressing to severe COVID-19 and/or hospitalization.  2. The significant known and potential risks and benefits of COVID monoclonal antibody, and the extent to which such potential risks and benefits are unknown.  3. Information on available alternative treatments and the risks and benefits of those alternatives, including clinical trials.  4. Patients treated with COVID monoclonal antibody should continue to self-isolate and use infection control measures (e.g., wear mask, isolate, social distance, avoid sharing personal items, clean and disinfect "high touch" surfaces, and frequent handwashing) according to CDC guidelines.   5. The patient or parent/caregiver has the option to accept or refuse COVID monoclonal antibody treatment.  After reviewing this information with the patient, The patient agreed to proceed with receiving casirivimab\imdevimab infusion and  will be provided a copy of the Fact sheet prior to receiving the infusion. [name] 08/16/2019 8:42 PM

## 2019-08-16 NOTE — Discharge Instructions (Addendum)
You should receive a phone call this evening from [name], NP.  She will go through the process of getting you established with the [facility] infusion center tomorrow.  Current plan is for 10:30AM.   Additionally, I want you to continue with conservative management for relief of your symptoms.  However if you to continue with Mucinex or Delsym as needed for your cough symptoms.  No antibiotics are warranted given that this is a viral infection.  Please continue to eat regular meals and drink plenty of fluids.  I also like you to continue checking your temperature regularly.  Please take Tylenol or ibuprofen as needed for fever control.  Return to the ED or seek immediate medical attention should you experience any new or worsening symptoms.  Otherwise, please continue to maintain isolation precautions.

## 2019-08-16 NOTE — ED Notes (Signed)
Patient verbalizes understanding of discharge instructions. Opportunity for questioning and answers were provided. Armband removed by staff, pt discharged from ED ambulatory to home.  

## 2019-08-16 NOTE — ED Notes (Signed)
Pt elected to be seen in ER 2/2 to her mom being referred to ER for SOB

## 2019-08-17 ENCOUNTER — Ambulatory Visit (HOSPITAL_COMMUNITY)
Admission: RE | Admit: 2019-08-17 | Discharge: 2019-08-17 | Disposition: A | Discharge status: Home / Self Care | Payer: BC Managed Care – PPO | Source: Ambulatory Visit | Attending: Pulmonary Disease | Admitting: Pulmonary Disease

## 2019-08-17 DIAGNOSIS — U071 COVID-19: Secondary | ICD-10-CM

## 2019-08-17 DIAGNOSIS — E669 Obesity, unspecified: Secondary | ICD-10-CM | POA: Diagnosis present

## 2019-08-17 MED ORDER — FAMOTIDINE IN NACL 20-0.9 MG/50ML-% IV SOLN: 20.0000 mg | Freq: Once | INTRAVENOUS | Status: DC | PRN

## 2019-08-17 MED ORDER — ALBUTEROL SULFATE HFA 108 (90 BASE) MCG/ACT IN AERS: 2.0000 | INHALATION_SPRAY | Freq: Once | RESPIRATORY_TRACT | Status: DC | PRN

## 2019-08-17 MED ORDER — SODIUM CHLORIDE 0.9 % IV SOLN: INTRAVENOUS | Status: DC | PRN

## 2019-08-17 MED ORDER — METHYLPREDNISOLONE SODIUM SUCC 125 MG IJ SOLR: 125.0000 mg | Freq: Once | INTRAMUSCULAR | Status: DC | PRN

## 2019-08-17 MED ORDER — EPINEPHRINE 0.3 MG/0.3ML IJ SOAJ: 0.3000 mg | Freq: Once | INTRAMUSCULAR | Status: DC | PRN

## 2019-08-17 MED ORDER — DIPHENHYDRAMINE HCL 50 MG/ML IJ SOLN: 50.0000 mg | Freq: Once | INTRAMUSCULAR | Status: DC | PRN

## 2019-08-17 NOTE — Discharge Instructions (Signed)
10 Things You Can Do to Manage Your COVID-19 Symptoms at Home If you have possible or confirmed COVID-19: 1. Stay home from work and school. And stay away from other public places. If you must go out, avoid using any kind of public transportation, ridesharing, or taxis. 2. Monitor your symptoms carefully. If your symptoms get worse, call your healthcare provider immediately. 3. Get rest and stay hydrated. 4. If you have a medical appointment, call the healthcare provider ahead of time and tell them that you have or may have COVID-19. 5. For medical emergencies, call 911 and notify the dispatch personnel that you have or may have COVID-19. 6. Cover your cough and sneezes with a tissue or use the inside of your elbow. 7. Wash your hands often with soap and water for at least 20 seconds or clean your hands with an alcohol-based hand sanitizer that contains at least 60% alcohol. 8. As much as possible, stay in a specific room and away from other people in your home. Also, you should use a separate bathroom, if available. If you need to be around other people in or outside of the home, wear a mask. 9. Avoid sharing personal items with other people in your household, like dishes, towels, and bedding. 10. Clean all surfaces that are touched often, like counters, tabletops, and doorknobs. Use household cleaning sprays or wipes according to the label instructions. cdc.gov/coronavirus 07/18/2018 What types of side effects do monoclonal antibody drugs cause?  Common side effects  In general, the more common side effects caused by monoclonal antibody drugs include: . Allergic reactions, such as hives or itching . Flu-like signs and symptoms, including chills, fatigue, fever, and muscle aches and pains . Nausea, vomiting . Diarrhea . Skin rashes . Low blood pressure   The CDC is recommending patients who receive monoclonal antibody treatments wait at least 90 days before being vaccinated.  Currently,  there are no data on the safety and efficacy of mRNA COVID-19 vaccines in persons who received monoclonal antibodies or convalescent plasma as part of COVID-19 treatment. Based on the estimated half-life of such therapies as well as evidence suggesting that reinfection is uncommon in the 90 days after initial infection, vaccination should be deferred for at least 90 days, as a precautionary measure until additional information becomes available, to avoid interference of the antibody treatment with vaccine-induced immune responses. 

## 2019-08-17 NOTE — Progress Notes (Signed)
  Diagnosis: COVID-19  Physician: Dr. Wright   Procedure: [facility] Med: casirivimab\imdevimab infusion - Provided patient with casirivimab\imdevimab fact sheet for patients, parents and caregivers prior to infusion.  Complications: No immediate complications noted.  Discharge: Discharged home   Jasmin Balik  Gonzalez 08/17/2019  

## 2020-09-24 ENCOUNTER — Telehealth: Payer: Self-pay

## 2020-09-24 NOTE — Telephone Encounter (Signed)
Jasmin Gonzalez called requesting a New Patient appt with Dr Jimmey Ralph. I informed pt that Dr Jimmey Ralph is currently not taking on new patients. Jasmin Gonzalez then stated that she had a new patent appt with Dr Artis Flock and it was cancelled due to her leaving. She then stated that she received a voicemail stating that Dr Jimmey Ralph would be taking her as a new patient. I do not see any documentation about the appt. Please Advise.

## 2020-09-24 NOTE — Telephone Encounter (Signed)
Patient is calling in stating that she was told that Dr.Parker was accepting, and was advised that he isnt accepting but patient would like to know if Jasmin Gonzalez would accept her as a patient.

## 2020-09-25 NOTE — Telephone Encounter (Signed)
Yes it is ok to schedule her for a new patient visit.  Jasmin Gonzalez. Jasmin Ralph, MD 09/25/2020 7:45 AM

## 2020-09-25 NOTE — Telephone Encounter (Signed)
See below

## 2020-09-28 NOTE — Telephone Encounter (Signed)
Patient is scheduled   

## 2020-10-21 ENCOUNTER — Ambulatory Visit: Payer: BC Managed Care – PPO | Admitting: Family Medicine

## 2020-12-03 ENCOUNTER — Encounter: Payer: Self-pay | Admitting: Family Medicine

## 2020-12-03 ENCOUNTER — Other Ambulatory Visit: Payer: Self-pay

## 2020-12-03 ENCOUNTER — Ambulatory Visit (INDEPENDENT_AMBULATORY_CARE_PROVIDER_SITE_OTHER): Payer: BC Managed Care – PPO | Admitting: Family Medicine

## 2020-12-03 VITALS — BP 118/80 | HR 76 | Temp 97.7°F | Ht 68.0 in | Wt 353.0 lb

## 2020-12-03 DIAGNOSIS — Z13220 Encounter for screening for lipoid disorders: Secondary | ICD-10-CM | POA: Diagnosis not present

## 2020-12-03 DIAGNOSIS — K219 Gastro-esophageal reflux disease without esophagitis: Secondary | ICD-10-CM

## 2020-12-03 DIAGNOSIS — R600 Localized edema: Secondary | ICD-10-CM | POA: Diagnosis not present

## 2020-12-03 DIAGNOSIS — D509 Iron deficiency anemia, unspecified: Secondary | ICD-10-CM | POA: Insufficient documentation

## 2020-12-03 DIAGNOSIS — R739 Hyperglycemia, unspecified: Secondary | ICD-10-CM | POA: Diagnosis not present

## 2020-12-03 LAB — COMPREHENSIVE METABOLIC PANEL
ALT: 11 U/L (ref 0–35)
AST: 14 U/L (ref 0–37)
Albumin: 4.2 g/dL (ref 3.5–5.2)
Alkaline Phosphatase: 48 U/L (ref 39–117)
BUN: 8 mg/dL (ref 6–23)
CO2: 27 mEq/L (ref 19–32)
Calcium: 8.6 mg/dL (ref 8.4–10.5)
Chloride: 103 mEq/L (ref 96–112)
Creatinine, Ser: 0.69 mg/dL (ref 0.40–1.20)
GFR: 105.12 mL/min (ref >60.00)
Glucose, Bld: 87 mg/dL (ref 70–99)
Potassium: 3.7 mEq/L (ref 3.5–5.1)
Sodium: 136 mEq/L (ref 135–145)
Total Bilirubin: 0.3 mg/dL (ref 0.2–1.2)
Total Protein: 7.3 g/dL (ref 6.0–8.3)

## 2020-12-03 LAB — LIPID PANEL
Cholesterol: 140 mg/dL (ref 0–200)
HDL: 41.7 mg/dL (ref >39.00)
LDL Cholesterol: 86 mg/dL (ref 0–99)
NonHDL: 97.89
Total CHOL/HDL Ratio: 3
Triglycerides: 61 mg/dL (ref 0.0–149.0)
VLDL: 12.2 mg/dL (ref 0.0–40.0)

## 2020-12-03 LAB — CBC
HCT: 30.4 % — ABNORMAL LOW (ref 36.0–46.0)
Hemoglobin: 8.9 g/dL — ABNORMAL LOW (ref 12.0–15.0)
MCHC: 29.2 g/dL — ABNORMAL LOW (ref 30.0–36.0)
MCV: 62.7 fl — ABNORMAL LOW (ref 78.0–100.0)
Platelets: 363 10*3/uL (ref 150.0–400.0)
RBC: 4.86 Mil/uL (ref 3.87–5.11)
RDW: 19 % — ABNORMAL HIGH (ref 11.5–15.5)
WBC: 4.1 10*3/uL (ref 4.0–10.5)

## 2020-12-03 LAB — HEMOGLOBIN A1C: Hgb A1c MFr Bld: 6 % (ref 4.6–6.5)

## 2020-12-03 LAB — TSH: TSH: 1.42 u[IU]/mL (ref 0.35–5.50)

## 2020-12-03 MED ORDER — PANTOPRAZOLE SODIUM 40 MG PO TBEC: 40.0000 mg | DELAYED_RELEASE_TABLET | Freq: Every day | ORAL | 3 refills | Status: DC

## 2020-12-03 NOTE — Progress Notes (Signed)
Jasmin Gonzalez is a 45 y.o. female who presents today for an office visit. She is establishing care.   Assessment/Plan:  New/Acute Problems: RUQ Pain No red flags.  Reassuring exam.  We will check labs today.  Possibly could be gastritis or related to her GERD.  Will empirically treat for GERD as below.  She will let us know if not improving and may consider right upper quadrant ultrasound versus referral to GI at that time.  Chronic Problems Addressed Today: Leg edema No red flags.  Likely venous insufficiency.  Discussed conservative measures including salt avoidance, and leg elevation. Weight loss will help as well.   Morbid obesity (HCC) BMI 53. Discussed management options.  Has referral to healthy weight and wellness.  GERD (gastroesophageal reflux disease) Patient is not having any typical burning sensation in her symptoms are most consistent with GERD.  We will empirically start Protonix for a month.  If symptoms are not improving will need referral to GI.   We discussed colon cancer screening.  She will discuss with her gynecologist as well.  We will place referral to GI for colonoscopy or order cologuard per patient preference.   She will follow up in 1 year for CPE.     Subjective:  HPI:  See A/P for status of chronic conditions.  For the last several months patient has had issues with discomfort in her upper abdomen and sensation of a foamy fluid in the back of her throat while sleeping.  This has been sporadic that over the last couple days has been more consistent.  She has not noticed any aggravating or alleviating factors.  She has sometimes had a gagging sensation.  No vomiting.  No reported constipation or diarrhea.  No fevers or chills.  She is concerned about possible acid reflux but does not have any burning sensation.  She had sleep study done several years ago which was negative for sleep apnea.  She has also had difficulties with lower extremity edema for  several years.  Is also concerned about her weight.  Is interested in weight loss management.  She had lost about 45 pounds a couple of years ago during COVID through diet and exercise.  ROS: Per HPI, otherwise a complete review of systems was negative.   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   LSIL (low grade squamous intraepithelial lesion) on Pap smear 9/12   Palpitations 06/12/2013   SOB (shortness of breath) 07/17/2013   Patient Active Problem List   Diagnosis Date Noted   GERD (gastroesophageal reflux disease) 12/03/2020   Morbid obesity (HCC) 12/03/2020   Leg edema 12/03/2020   Past Surgical History:  Procedure Laterality Date   CESAREAN SECTION      Family History  Problem Relation Age of Onset   COPD Maternal Grandfather    Hypertension Mother     Medications- reviewed and updated Current Outpatient Medications  Medication Sig Dispense Refill   Multiple Vitamin (MULTIVITAMIN) capsule Take 1 capsule by mouth daily.     pantoprazole (PROTONIX) 40 MG tablet Take 1 tablet (40 mg total) by mouth daily. 30 tablet 3   No current facility-administered medications for this visit.    Allergies-reviewed and updated No Known Allergies  Social History   Socioeconomic History   Marital status: Single  Spouse name: n/a   Number of children: 1   Years of education: Master's +   Highest education level: Not on file  Occupational History   Occupation: Leisure centre manager    Comment: [employer]  Tobacco Use   Smoking status: Never   Smokeless tobacco: Never  Substance and Sexual Activity   Alcohol use: Yes    Alcohol/week: 0.0 standard drinks    Comment: rare-none   Drug use: No   Sexual activity: Not Currently    Birth control/protection: None    Comment: currently abstinent (01/19/2015)  Other Topics  Concern   Not on file  Social History Narrative   Has completed the first year of a EdD in Information Technology/Education.   Lives with her son and her parents.   Social Determinants of Health   Financial Resource Strain: Not on file  Food Insecurity: Not on file  Transportation Needs: Not on file  Physical Activity: Not on file  Stress: Not on file  Social Connections: Not on file          Objective:  Physical Exam: BP 118/80   Pulse 76   Temp 97.7 F (36.5 C) (Temporal)   Ht 5\' 8"  (1.727 m)   Wt (!) 353 lb (160.1 kg)   SpO2 100%   BMI 53.67 kg/m   Gen: No acute distress, resting comfortably CV: Regular rate and rhythm with no murmurs appreciated Pulm: Normal work of breathing, clear to auscultation bilaterally with no crackles, wheezes, or rhonchi GI: s, NT, ND.  Bowel sounds present.  No rebound or guarding. Neuro: Grossly normal, moves all extremities Psych: Normal affect and thought content      Jasmin Gonzalez M. , MD 12/03/2020 9:52 AM

## 2020-12-03 NOTE — Assessment & Plan Note (Signed)
BMI 53. Discussed management options.  Has referral to healthy weight and wellness.

## 2020-12-03 NOTE — Patient Instructions (Signed)
It was very nice to see you today!  You are probably having reflux.  Please try the Protonix for the next month.  Hopefully how this is working over the next few weeks.  We will check blood work to look at your liver, gallbladder and pancreas.  We will refer you to see a weight management specialist.  I will see back in year for your next annual checkup.  Please come back to see me sooner if needed.  Take care, Dr Jimmey Ralph  PLEASE NOTE:  If you had any lab tests please let us know if you have not heard back within a few days. You may see your results on mychart before we have a chance to review them but we will give you a call once they are reviewed by [place]. If we ordered any referrals today, please let us know if you have not heard from their office within the next week.   Please try these tips to maintain a healthy lifestyle:  Eat at least 3 REAL meals and 1-2 snacks per day.  Aim for no more than 5 hours between eating.  If you eat breakfast, please do so within one hour of getting up.   Each meal should contain half fruits/vegetables, one quarter protein, and one quarter carbs (no bigger than a computer mouse)  Cut down on sweet beverages. This includes juice, soda, and sweet tea.   Drink at least 1 glass of water with each meal and aim for at least 8 glasses per day  Exercise at least 150 minutes every week.

## 2020-12-03 NOTE — Assessment & Plan Note (Signed)
No red flags.  Likely venous insufficiency.  Discussed conservative measures including salt avoidance, and leg elevation. Weight loss will help as well.

## 2020-12-03 NOTE — Progress Notes (Signed)
Please inform patient of the following:  She is anemic. This is probably due to her menstrual cycles. She should be taking an iron supplement if she is not doing so already. Recommend taking ferrous sulfate 65mg  every other day on an empty stomach with vitamin C. We should recheck in 3-6 months.   Her blood sugar is borderline diabetic. Do not need to start medicaitons but this should come down with weight loss.   All of her other labs are NORMAL. Would like for her to let know if her symptoms are not improving over the next few weeks with the acid blocking medications.

## 2020-12-03 NOTE — Assessment & Plan Note (Signed)
Patient is not having any typical burning sensation in her symptoms are most consistent with GERD.  We will empirically start Protonix for a month.  If symptoms are not improving will need referral to GI.

## 2020-12-04 ENCOUNTER — Other Ambulatory Visit: Payer: Self-pay

## 2020-12-04 DIAGNOSIS — R739 Hyperglycemia, unspecified: Secondary | ICD-10-CM

## 2020-12-04 DIAGNOSIS — D649 Anemia, unspecified: Secondary | ICD-10-CM

## 2020-12-04 MED ORDER — IRON (FERROUS SULFATE) 325 (65 FE) MG PO TABS: 325.0000 mg | ORAL_TABLET | Freq: Every day | ORAL | 3 refills | Status: DC

## 2020-12-04 NOTE — Addendum Note (Signed)
Addended by: Calton Golds A on: 12/04/2020 10:23 AM   Modules accepted: Orders

## 2021-07-28 ENCOUNTER — Ambulatory Visit (INDEPENDENT_AMBULATORY_CARE_PROVIDER_SITE_OTHER): Payer: BC Managed Care – PPO | Admitting: Family

## 2021-07-28 ENCOUNTER — Encounter: Payer: Self-pay | Admitting: Family

## 2021-07-28 VITALS — BP 137/88 | HR 81 | Temp 97.9°F | Ht 68.0 in | Wt 355.2 lb

## 2021-07-28 DIAGNOSIS — D509 Iron deficiency anemia, unspecified: Secondary | ICD-10-CM | POA: Diagnosis not present

## 2021-07-28 DIAGNOSIS — R1011 Right upper quadrant pain: Secondary | ICD-10-CM | POA: Diagnosis not present

## 2021-07-28 NOTE — Addendum Note (Signed)
Addended by: Lorn Junes on: 07/28/2021 03:24 PM   Modules accepted: Orders

## 2021-07-28 NOTE — Assessment & Plan Note (Signed)
DX last November, pt taking iron pills qd, rechecking today iron level, but did not ask for testing during visit, so CBC not drawn. Will make pt aware during lab review.

## 2021-07-28 NOTE — Addendum Note (Signed)
Addended by: Lorn Junes on: 07/28/2021 03:25 PM   Modules accepted: Orders

## 2021-07-28 NOTE — Patient Instructions (Addendum)
As discussed - Avoid greasy, fried foods as much as possible! This includes fast food, burgers or cheeseburgers and dairy foods as they usually have a high saturated fat content.  Continue trying to lose weight, and check with your insurance to see if any weight loss meds are covered.  Also, just FYI -  We didn't discuss rechecking your anemia labs during the visit, so the lab tech is only checking your actual iron levels, but not a CBC which contains the Hgb which indicates if your anemia is improved, due to not drawing the right tube. But knowing if your iron stores are doing well will still be helpful.

## 2021-07-28 NOTE — Progress Notes (Signed)
Patient ID: Jasmin Gonzalez, female    DOB: March 08, 1975, 46 y.o.   MRN: [id number]  Chief Complaint  Patient presents with   Abdominal Pain    Pt c/o abdominal pain on right side on and off since April. She states it is mostly only when she has had dairy products. Has not tried anything for it. When she stopped dairy she wasn't having the pain. Pt states the pain feels like spiking/ achy pain when she lays on that side. Prescribed Protonix by Dr. Jimmey Ralph but never received it.     HPI: IDA:  last labs 11/2020 - pt told she was anemic and started OTC iron pills, would like to recheck her level today. Abdominal pain: Reports issues with discomfort in her upper abdomen radiating to her back after she eats cheese or ice cream.  She has not noticed any alleviating factors.  She has sometimes had a gagging sensation.  No nausea or vomiting.  No reported constipation or diarrhea.  No fevers or chills. She saw her PCP about 6 mos ago with similar sx and prescribed Protonix, but pt never started the med. She does not c/o any reflux sx other than the pain associated with eating dairy or during the night if lying on her right side.   Assessment & Plan:   Problem List Items Addressed This Visit       Other   Iron deficiency anemia    DX last November, pt taking iron pills qd, rechecking today iron level, but did not ask for testing during visit, so CBC not drawn. Will make pt aware during lab review.      Relevant Orders   Iron, TIBC and Ferritin Panel   Other Visit Diagnoses     Right upper quadrant abdominal pain    -  Primary Advised pt to continue to avoid dairy foods if this is a trigger for her pain. Also advised on avoiding all greasy or fried foods as his can aggravate her GB and cause pain. Needs to try and lose weight also, offered wt loss strategies. Will check food allergy panel today.    Relevant Orders   Food Allergy Profile   Basic Metabolic Panel (BMET)      Subjective:     Outpatient Medications Prior to Visit  Medication Sig Dispense Refill   Iron, Ferrous Sulfate, 325 (65 Fe) MG TABS Take 325 mg by mouth daily. 30 tablet 3   Multiple Vitamin (MULTIVITAMIN) capsule Take 1 capsule by mouth daily.     pantoprazole (PROTONIX) 40 MG tablet Take 1 tablet (40 mg total) by mouth daily. (Patient not taking: Reported on 07/28/2021) 30 tablet 3   No facility-administered medications prior to visit.   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  Constitutional:      Appearance: Normal appearance. She is morbidly obese.  Cardiovascular:     Rate and Rhythm: Normal rate and regular rhythm.  Pulmonary:     Effort: Pulmonary effort is normal.     Breath sounds:  Normal breath sounds.  Abdominal:     General: Abdomen is protuberant. There is no distension.     Palpations: Abdomen is soft.  Musculoskeletal:        General: Normal range of motion.  Skin:    General: Skin is warm and dry.  Neurological:     Mental Status: She is alert.  Psychiatric:        Mood and Affect: Mood normal.        Behavior: Behavior normal.    BP 137/88 (BP Location: Left Arm, Patient Position: Sitting, Cuff Size: Large)   Pulse 81   Temp 97.9 F (36.6 C) (Temporal)   Ht 5\' 8"  (1.727 m)   Wt (!) 355 lb 4 oz (161.1 kg)   LMP 07/23/2021 (Exact Date)   SpO2 99%   BMI 54.02 kg/m  Wt Readings from Last 3 Encounters:  07/28/21 (!) 355 lb 4 oz (161.1 kg)  12/03/20 (!) 353 lb (160.1 kg)  01/01/16 (!) 366 lb (166 kg)       01/03/16, NP

## 2021-07-29 LAB — FOOD ALLERGY PROFILE
Allergen, Salmon, f41: <0.1 kU/L
Almonds: <0.1 kU/L
CLASS: 0
CLASS: 0
CLASS: 0
CLASS: 0
CLASS: 0
CLASS: 0
CLASS: 0
CLASS: 0
CLASS: 0
CLASS: 0
CLASS: 0
Cashew IgE: <0.1 kU/L
Class: 0
Class: 0
Class: 0
Class: 0
Egg White IgE: <0.1 kU/L
Fish Cod: <0.1 kU/L
Hazelnut: <0.1 kU/L
Milk IgE: <0.1 kU/L
Peanut IgE: <0.1 kU/L
Scallop IgE: <0.1 kU/L
Sesame Seed f10: <0.1 kU/L
Shrimp IgE: <0.1 kU/L
Soybean IgE: <0.1 kU/L
Tuna IgE: <0.1 kU/L
Walnut: <0.1 kU/L
Wheat IgE: <0.1 kU/L

## 2021-07-29 LAB — IBC + FERRITIN
Ferritin: 7.3 ng/mL — ABNORMAL LOW (ref 10.0–291.0)
Iron: 21 ug/dL — ABNORMAL LOW (ref 42–145)
Saturation Ratios: 4.4 % — ABNORMAL LOW (ref 20.0–50.0)
TIBC: 480.2 ug/dL — ABNORMAL HIGH (ref 250.0–450.0)
Transferrin: 343 mg/dL (ref 212.0–360.0)

## 2021-07-29 LAB — BASIC METABOLIC PANEL
BUN: 10 mg/dL (ref 6–23)
CO2: 23 mEq/L (ref 19–32)
Calcium: 8.6 mg/dL (ref 8.4–10.5)
Chloride: 102 mEq/L (ref 96–112)
Creatinine, Ser: 0.72 mg/dL (ref 0.40–1.20)
GFR: 100.81 mL/min (ref >60.00)
Glucose, Bld: 83 mg/dL (ref 70–99)
Potassium: 3.8 mEq/L (ref 3.5–5.1)
Sodium: 135 mEq/L (ref 135–145)

## 2021-07-29 LAB — INTERPRETATION:

## 2021-07-30 ENCOUNTER — Other Ambulatory Visit: Payer: Self-pay | Admitting: Family

## 2021-07-30 DIAGNOSIS — D500 Iron deficiency anemia secondary to blood loss (chronic): Secondary | ICD-10-CM

## 2021-07-30 MED ORDER — FERROUS SULFATE DRIED ER 160 (50 FE) MG PO TBCR: 160.0000 mg | EXTENDED_RELEASE_TABLET | Freq: Every day | ORAL | 5 refills | Status: DC

## 2021-07-30 NOTE — Progress Notes (Signed)
Hi Jasmin Gonzalez,  Your electrolytes, glucose, & kidney function are normal. Your iron level is low. Are you taking the prescription iron pill Dr. Jimmey Ralph sent, or are you out of refills? I will go ahead & send a refill.

## 2021-10-11 ENCOUNTER — Encounter: Payer: Self-pay | Admitting: *Deleted

## 2021-12-07 ENCOUNTER — Encounter: Payer: BC Managed Care – PPO | Admitting: Family Medicine

## 2021-12-30 ENCOUNTER — Encounter: Payer: Self-pay | Admitting: *Deleted

## 2022-01-04 ENCOUNTER — Encounter: Payer: BC Managed Care – PPO | Admitting: Family Medicine

## 2022-01-19 IMAGING — DX DG CHEST 1V PORT
1 series · 1 of 1 positions shown · non-contrast
Comparison: 05/31/2015

CLINICAL DATA: Shortness of breath.  COVID positive

EXAM:
PORTABLE CHEST 1 VIEW

[chest ap]
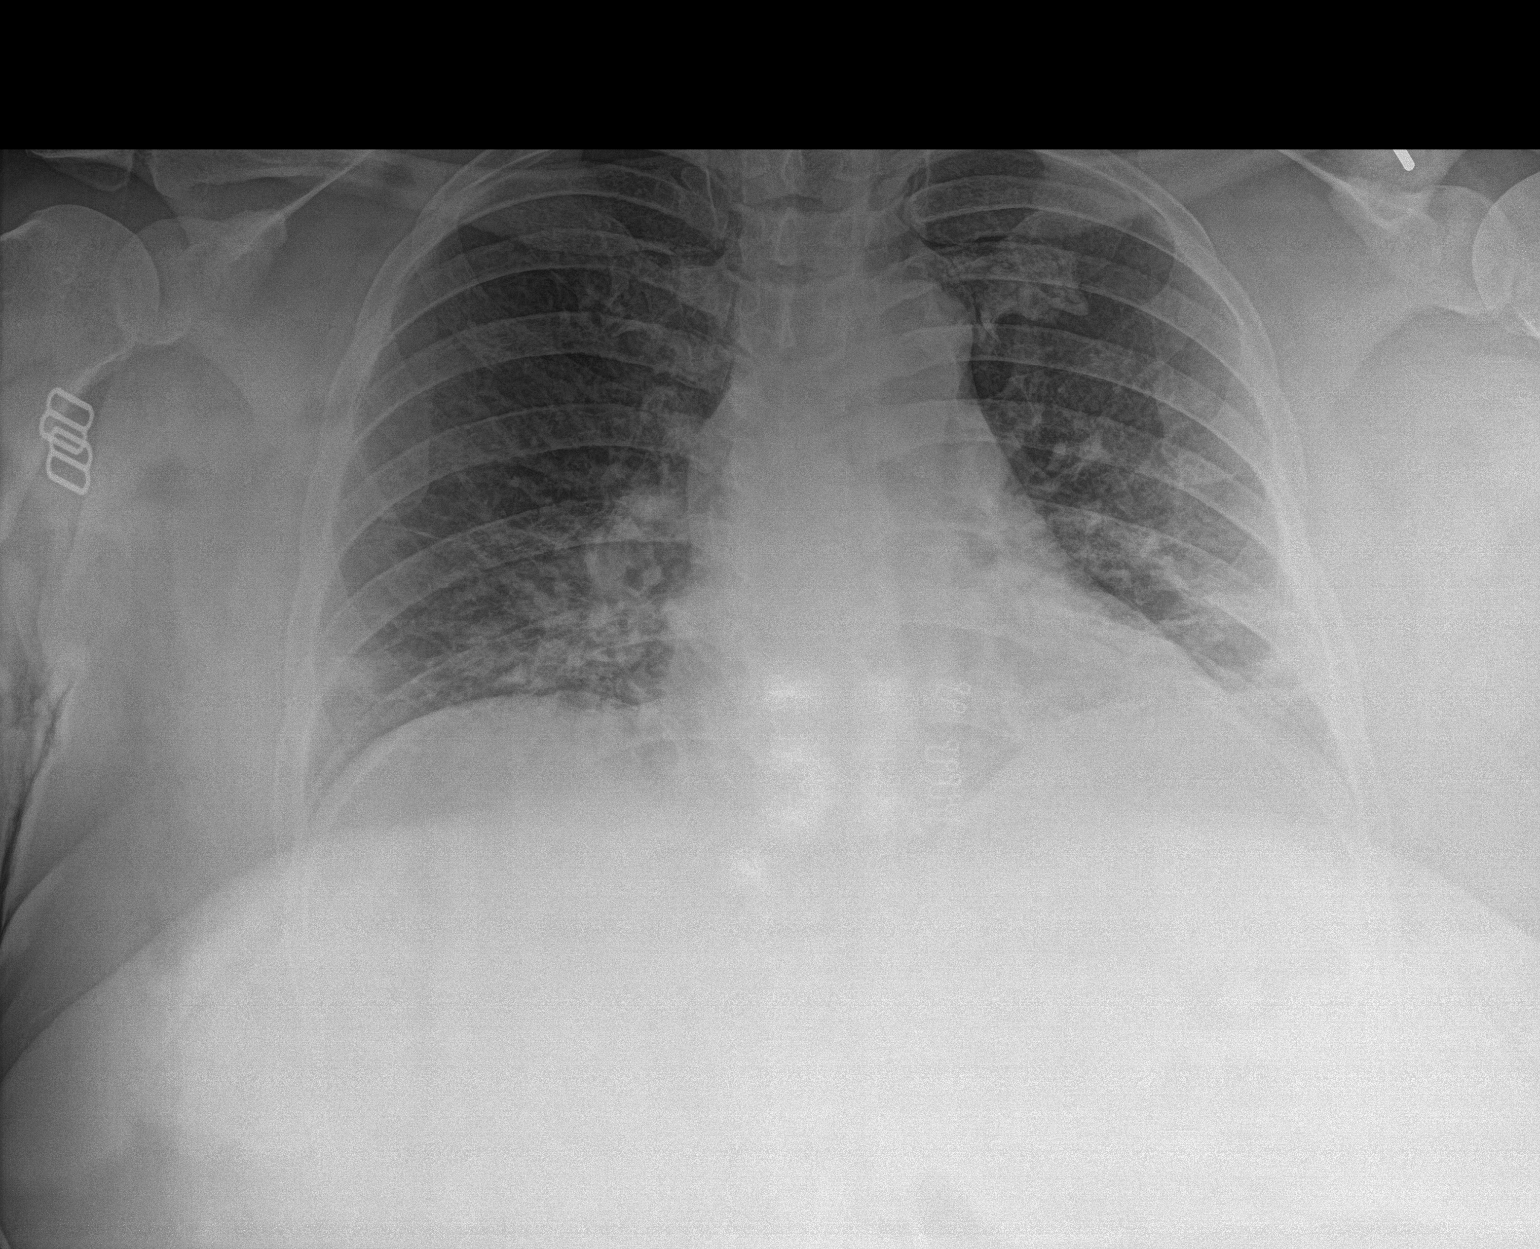

[1 of 1 positions shown; findings below may reference images not displayed]

FINDINGS: Low volume chest with mild patchy infiltrate on both sides. No edema
or effusion. Normal heart size and mediastinal contours
IMPRESSION: Low volume chest with atypical pneumonia pattern.

## 2022-01-24 ENCOUNTER — Encounter: Payer: BC Managed Care – PPO | Admitting: Family Medicine

## 2023-06-06 ENCOUNTER — Encounter (HOSPITAL_COMMUNITY): Payer: Self-pay

## 2023-06-06 ENCOUNTER — Ambulatory Visit (HOSPITAL_COMMUNITY)
Admission: EM | Admit: 2023-06-06 | Discharge: 2023-06-06 | Disposition: A | Discharge status: Home / Self Care | Attending: Nurse Practitioner | Admitting: Nurse Practitioner

## 2023-06-06 DIAGNOSIS — J069 Acute upper respiratory infection, unspecified: Secondary | ICD-10-CM | POA: Diagnosis not present

## 2023-06-06 LAB — POC COVID19/FLU A&B COMBO
Covid Antigen, POC: NEGATIVE
Influenza A Antigen, POC: NEGATIVE
Influenza B Antigen, POC: NEGATIVE

## 2023-06-06 LAB — POCT RAPID STREP A (OFFICE): Rapid Strep A Screen: NEGATIVE

## 2023-06-06 MED ORDER — METHYLPREDNISOLONE 4 MG PO TBPK: ORAL_TABLET | ORAL | 0 refills | Status: DC

## 2023-06-06 MED ORDER — PSEUDOEPH-BROMPHEN-DM 30-2-10 MG/5ML PO SYRP: 10.0000 mL | ORAL_SOLUTION | Freq: Four times a day (QID) | ORAL | 0 refills | Status: DC | PRN

## 2023-06-06 MED ORDER — FLUTICASONE PROPIONATE 50 MCG/ACT NA SUSP: 2.0000 | Freq: Every day | NASAL | 0 refills | Status: DC

## 2023-06-06 NOTE — Discharge Instructions (Addendum)
 STREP, FLU & COVID negative.   Your symptoms are most likely caused by a respiratory infection, which affects areas like your nose, throat, or lungs. This type of infection is usually caused by a virus. Since your illness is caused by a virus, antibiotics won't help because they only treat infections caused by bacteria.  Take the medications that were prescribed to you as directed. If you have a fever, headache, or body aches, you can also take Tylenol or ibuprofen to help you feel more comfortable. Be sure to drink plenty of fluids to stay hydrated--aim for enough to keep your urine a pale yellow color. This will also help to thin mucus and make it easier to clear from your body.   Using a cool mist humidifier at home to keep humidity levels above 50% can be helpful. You can also inhale steam for 10 to 15 minutes, 3 to 4 times a day. This can be done by sitting in the bathroom with a hot shower running, or by using over-the-counter vapor shower tablets to help with nasal congestion. Try to avoid cool or dry air as much as possible. When you sleep, keep your head elevated to help reduce post-nasal drainage. Be sure to get enough rest every night to support your recovery. Don't forget to replace your toothbrush once you start feeling better.   It's normal for a cough to linger for several weeks after a respiratory illness, even after other symptoms have resolved. This happens because the airways remain irritated and take time to fully heal. As long as the cough gradually improves and there are no new concerning symptoms, this is part of the normal recovery process.   If your symptoms get worse or if you develop any new or concerning symptoms, go to the emergency room right away. If you're not feeling better in a few days, follow up with your primary care provider.

## 2023-06-06 NOTE — ED Triage Notes (Signed)
 Patient here today with c/o productive cough and ST since Friday. She has been taking Mucinex  DM with some relief. Coworker was sick a week ago.

## 2023-06-06 NOTE — ED Provider Notes (Signed)
 [facility]    CSN: [id number] Arrival date & time: 06/06/23  0803      History   Chief Complaint Chief Complaint  Patient presents with   Cough    HPI Jasmin Gonzalez is a 48 y.o. female.   Jasmin Gonzalez is a 48 y.o. female that presents with symptoms of an upper respiratory infection, including sore throat, cough, and nasal congestion. The illness began last Thursday with sniffles, followed by a sore throat on Friday that lasted until yesterday. Today, the patient reports a cough with chest pain upon coughing hard, and yellow-greenish, mucus when blowing their nose.  The patient experiences runny nose and nasal congestion. She also reports a little shortness of breath but denies wheezing. The cough is described as painful, particularly in the chest area. The patient denies fever, chills, body aches, ear pain, sneezing, problems swallowing, nausea, vomiting, diarrhea, headaches, or dizziness. They report feeling tired. The patient has been taking Mucinex -D, which has been helpful. She does not have any sinus pressure and deny issues with allergies. The patient does not smoke or vape. Deep breathing does not cause chest pain. The patient reports being around someone who was sick recently, which may be the source of their illness.  The following portions of the patient's history were reviewed and updated as appropriate: allergies, current medications, past family history, past medical history, past social history, past surgical history, and problem list.      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   LSIL (low grade squamous intraepithelial lesion) on Pap smear 9/12   Palpitations 06/12/2013   SOB (shortness of breath) 07/17/2013    Patient Active Problem List   Diagnosis Date Noted   GERD (gastroesophageal reflux disease) 12/03/2020   Morbid obesity (HCC) 12/03/2020    Leg edema 12/03/2020   Iron  deficiency anemia 12/03/2020   Hyperglycemia 12/03/2020    Past Surgical History:  Procedure Laterality Date   CESAREAN SECTION      OB History     Gravida  3   Para  1   Term      Preterm      AB      Living  1      SAB      IAB      Ectopic      Multiple      Live Births               Home Medications    Prior to Admission medications   Medication Sig Start Date End Date Taking? Authorizing Provider  brompheniramine-pseudoephedrine -DM 30-2-10 MG/5ML syrup Take 10 mLs by mouth every 6 (six) hours as needed. 06/06/23  Yes Shai Mckenzie, FNP  fluticasone (FLONASE) 50 MCG/ACT nasal spray Place 2 sprays into both nostrils daily. 06/06/23  Yes Maryruth Sol, FNP  methylPREDNISolone  (MEDROL  DOSEPAK) 4 MG TBPK tablet Take as directed 06/06/23  Yes Shaquile Lutze, Ova Bloomer, FNP  ferrous sulfate  (SLOW FE) 160 (50 Fe) MG TBCR SR tablet Take 1 tablet (160 mg total) by mouth daily. 07/30/21   Versa Gore, NP  Multiple Vitamin (MULTIVITAMIN) capsule Take 1 capsule by mouth daily.    [provider]    Family History Family History  Problem Relation Age of Onset   COPD Maternal Grandfather    Hypertension Mother     Social History Social  History   Tobacco Use   Smoking status: Never   Smokeless tobacco: Never  Substance Use Topics   Alcohol use: Yes    Alcohol/week: 0.0 standard drinks of alcohol    Comment: rare-none   Drug use: No     Allergies   Patient has no known allergies.   Review of Systems Review of Systems  Constitutional:  Positive for fatigue. Negative for chills and fever.  HENT:  Positive for congestion, rhinorrhea, sneezing, sore throat and voice change. Negative for ear pain and trouble swallowing.   Respiratory:  Positive for cough, chest tightness (pain with coughing) and shortness of breath (some). Negative for wheezing.   Gastrointestinal:  Negative for diarrhea, nausea and vomiting.   Musculoskeletal:  Negative for myalgias.  Neurological:  Positive for headaches. Negative for dizziness.  All other systems reviewed and are negative.    Physical Exam Triage Vital Signs ED Triage Vitals [06/06/23 0827]  Encounter Vitals Group     BP 128/86     Systolic BP Percentile      Diastolic BP Percentile      Pulse Rate 78     Resp 16     Temp 98.8 F (37.1 C)     Temp Source Oral     SpO2 98 %     Weight      Height      Head Circumference      Peak Flow      Pain Score 0     Pain Loc      Pain Education      Exclude from Growth Chart    No data found.  Updated Vital Signs BP 128/86 (BP Location: Left Arm)   Pulse 78   Temp 98.8 F (37.1 C) (Oral)   Resp 16   LMP 05/26/2023 (Exact Date)   SpO2 98%   Visual Acuity Right Eye Distance:   Left Eye Distance:   Bilateral Distance:    Right Eye Near:   Left Eye Near:    Bilateral Near:     Physical Exam Vitals and nursing note reviewed.  Constitutional:      General: She is awake. She is not in acute distress.    Appearance: Normal appearance. She is well-developed. She is obese. She is not ill-appearing, toxic-appearing or diaphoretic.  HENT:     Head: Normocephalic.     Nose: Congestion present.     Right Sinus: No maxillary sinus tenderness or frontal sinus tenderness.     Left Sinus: No maxillary sinus tenderness or frontal sinus tenderness.     Mouth/Throat:     Lips: Pink.     Mouth: Mucous membranes are moist.     Pharynx: Uvula midline. Pharyngeal swelling present. No oropharyngeal exudate, posterior oropharyngeal erythema or uvula swelling.  Eyes:     General: Vision grossly intact.     Conjunctiva/sclera: Conjunctivae normal.  Cardiovascular:     Rate and Rhythm: Normal rate and regular rhythm.     Heart sounds: Normal heart sounds.  Pulmonary:     Effort: Pulmonary effort is normal. No tachypnea or respiratory distress.     Breath sounds: Normal breath sounds and air entry. No  decreased air movement or transmitted upper airway sounds. No decreased breath sounds, wheezing, rhonchi or rales.  Musculoskeletal:        General: Normal range of motion.     Cervical back: Full passive range of motion without pain, normal range of motion and neck  supple.     Right lower leg: No edema.     Left lower leg: No edema.  Lymphadenopathy:     Cervical: No cervical adenopathy.  Skin:    General: Skin is warm and dry.  Neurological:     General: No focal deficit present.     Mental Status: She is alert and oriented to person, place, and time.  Psychiatric:        Behavior: Behavior is cooperative.      UC Treatments / Results  Labs (all labs ordered are listed, but only abnormal results are displayed) Labs Reviewed  POCT RAPID STREP A (OFFICE) - Normal  POC COVID19/FLU A&B COMBO - Normal    EKG   Radiology No results found.  Procedures Procedures (including critical care time)  Medications Ordered in UC Medications - No data to display  Initial Impression / Assessment and Plan / UC Course  I have reviewed the triage vital signs and the nursing notes.  Pertinent labs & imaging results that were available during my care of the patient were reviewed by me and considered in my medical decision making (see chart for details).    48 year old female presenting with symptoms consistent with a viral upper respiratory infection. On examination, her tonsils were swollen but without exudates or significant erythema, likely secondary to postnasal drainage and frequent coughing. Lymph nodes were not enlarged, sinuses were non-tender and clear, and lung auscultation was normal. She is afebrile and appears nontoxic. Rapid tests for influenza, COVID-19, and streptococcal pharyngitis were all negative. The clinical picture supports a viral etiology. She was prescribed a Medrol  Dosepak, Flonase, and Bromfed DM to help manage symptoms. Supportive care recommendations were  provided, and return precautions and follow-up instructions were discussed.  Today's evaluation has revealed no signs of a dangerous process. Discussed diagnosis with patient and/or guardian. Patient and/or guardian aware of their diagnosis, possible red flag symptoms to watch out for and need for close follow up. Patient and/or guardian understands verbal and written discharge instructions. Patient and/or guardian comfortable with plan and disposition.  Patient and/or guardian has a clear mental status at this time, good insight into illness (after discussion and teaching) and has clear judgment to make decisions regarding their care  Documentation was completed with the aid of voice recognition software. Transcription may contain typographical errors. Final Clinical Impressions(s) / UC Diagnoses   Final diagnoses:  Viral URI with cough     Discharge Instructions      STREP, FLU & COVID negative.   Your symptoms are most likely caused by a respiratory infection, which affects areas like your nose, throat, or lungs. This type of infection is usually caused by a virus. Since your illness is caused by a virus, antibiotics won't help because they only treat infections caused by bacteria.  Take the medications that were prescribed to you as directed. If you have a fever, headache, or body aches, you can also take Tylenol or ibuprofen to help you feel more comfortable. Be sure to drink plenty of fluids to stay hydrated--aim for enough to keep your urine a pale yellow color. This will also help to thin mucus and make it easier to clear from your body.   Using a cool mist humidifier at home to keep humidity levels above 50% can be helpful. You can also inhale steam for 10 to 15 minutes, 3 to 4 times a day. This can be done by sitting in the bathroom with a hot shower running,  or by using over-the-counter vapor shower tablets to help with nasal congestion. Try to avoid cool or dry air as much as  possible. When you sleep, keep your head elevated to help reduce post-nasal drainage. Be sure to get enough rest every night to support your recovery. Don't forget to replace your toothbrush once you start feeling better.   It's normal for a cough to linger for several weeks after a respiratory illness, even after other symptoms have resolved. This happens because the airways remain irritated and take time to fully heal. As long as the cough gradually improves and there are no new concerning symptoms, this is part of the normal recovery process.   If your symptoms get worse or if you develop any new or concerning symptoms, go to the emergency room right away. If you're not feeling better in a few days, follow up with your primary care provider.       ED Prescriptions     Medication Sig Dispense Auth. Provider   fluticasone (FLONASE) 50 MCG/ACT nasal spray Place 2 sprays into both nostrils daily. 16 g Maryruth Sol, FNP   methylPREDNISolone  (MEDROL  DOSEPAK) 4 MG TBPK tablet Take as directed 21 tablet Willow Reczek, FNP   brompheniramine-pseudoephedrine -DM 30-2-10 MG/5ML syrup Take 10 mLs by mouth every 6 (six) hours as needed. 120 mL Maryruth Sol, FNP      PDMP not reviewed this encounter.   Beola Brazil [place], [place] 06/06/23 [phone]

## 2023-10-05 ENCOUNTER — Ambulatory Visit: Admitting: Podiatry

## 2023-10-05 ENCOUNTER — Encounter: Payer: Self-pay | Admitting: Podiatry

## 2023-10-05 DIAGNOSIS — L603 Nail dystrophy: Secondary | ICD-10-CM

## 2023-10-05 DIAGNOSIS — R234 Changes in skin texture: Secondary | ICD-10-CM

## 2023-10-05 NOTE — Progress Notes (Signed)
  Subjective:  Patient ID: GAZELLA ANGLIN, female    DOB: [birth year],  MRN: [id number] HPI Chief Complaint  Patient presents with   Toe Pain    Plantar hallux left - cracked skin last week, went to Urgent Care-glued it back together, put on clindamycin, feels some better now   Nail Problem    Toenails bilateral - dark, thick nails    New Patient (Initial Visit)    48 y.o. female presents with the above complaint.   ROS: Denies fever chills nausea muscle aches pains calf pain back pain chest pain shortness of breath.  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  General: Well developed, nourished, in no acute distress, alert and oriented x3   Dermatological: Skin is warm, dry and supple bilateral. Nails x 10 are well maintained; remaining integument appears unremarkable at this time. There are no open sores, no preulcerative lesions, no rash or signs of infection present.  Split skin underneath the hallux appears to be healing very nicely no signs of infection.  She does appear to have some tinea pedis and thickening and darkening of the toenails.  They are well-maintained  Vascular: Dorsalis Pedis artery and Posterior Tibial artery pedal pulses are 2/4 bilateral with immedate capillary fill time.  Pedal hair growth present. No varicosities and no lower extremity edema present bilateral.   Neruologic: Grossly intact via light touch bilateral. Vibratory intact via tuning fork bilateral. Protective threshold with Semmes Wienstein monofilament intact to all pedal sites bilateral. Patellar and Achilles deep tendon reflexes 2+ bilateral. No Babinski or clonus noted bilateral.   Musculoskeletal: No gross boney pedal deformities bilateral. No pain, crepitus, or limitation noted with foot and ankle range of motion bilateral. Muscular strength 5/5 in all groups tested bilateral.  Gait: Unassisted, Nonantalgic.    Radiographs:  None taken  Assessment & Plan:   Assessment: Nail dystrophy skin fissure healing and nail dystrophy.  Plan: Samples of the skin and nail were taken today for pathologic evaluation follow-up with her in 1 month     Kumiko Fishman T. [place], [place]

## 2023-11-02 ENCOUNTER — Ambulatory Visit: Admitting: Podiatry

## 2023-11-02 ENCOUNTER — Encounter: Payer: Self-pay | Admitting: Podiatry

## 2023-11-02 DIAGNOSIS — L603 Nail dystrophy: Secondary | ICD-10-CM | POA: Diagnosis not present

## 2023-11-02 MED ORDER — TERBINAFINE HCL 250 MG PO TABS: 250.0000 mg | ORAL_TABLET | Freq: Every day | ORAL | 0 refills | Status: AC

## 2023-11-02 NOTE — Progress Notes (Signed)
 She presents today for follow-up of her nail pathology.  She states that things are unchanged at this point.  Objective: Vital signs are stable alert oriented x 3.  Nail pathology does demonstrate nail dystrophy, yeast, Trichophyton rubrum.  Assessment: Onychomycosis.  Plan: Discussed etiology pathology and surgical therapies at this point discussed laser therapy and oral therapy she would like to try oral therapy we did discuss the possible side effects complications associated with this drug she understands and is amenable to and understands that she will have to have blood work done next visit.  Started her on Lamisil tablets 250 mg.  She will take 1 tablet daily and I will follow-up with her in 1 month.  Questions or concerns she will notify the office immediately

## 2023-12-07 ENCOUNTER — Ambulatory Visit: Admitting: Podiatry
